# Patient Record
Sex: Male | Born: 1960 | Race: White | Hispanic: No | Smoking: Never smoker
Health system: Southern US, Community
[De-identification: ages and names within clinical notes are randomized; demographics above are authoritative.]

## PROBLEM LIST (undated history)

## (undated) DIAGNOSIS — Z9889 Other specified postprocedural states: Secondary | ICD-10-CM

## (undated) DIAGNOSIS — Z87442 Personal history of urinary calculi: Secondary | ICD-10-CM

## (undated) DIAGNOSIS — E039 Hypothyroidism, unspecified: Secondary | ICD-10-CM

## (undated) DIAGNOSIS — R112 Nausea with vomiting, unspecified: Secondary | ICD-10-CM

## (undated) HISTORY — DX: Hypothyroidism, unspecified: E03.9

## (undated) HISTORY — PX: SHOULDER ARTHROSCOPY: SHX128

---

## 2003-03-04 ENCOUNTER — Encounter: Payer: Self-pay | Admitting: Internal Medicine

## 2003-03-04 ENCOUNTER — Encounter: Admission: RE | Admit: 2003-03-04 | Discharge: 2003-03-04 | Payer: Self-pay | Admitting: Internal Medicine

## 2006-05-08 ENCOUNTER — Ambulatory Visit: Payer: Self-pay | Admitting: Internal Medicine

## 2006-06-19 ENCOUNTER — Ambulatory Visit: Payer: Self-pay | Admitting: Internal Medicine

## 2006-06-19 LAB — CONVERTED CEMR LAB
ALT: 28 units/L (ref 0–40)
AST: 24 units/L (ref 0–37)
Albumin: 4.1 g/dL (ref 3.5–5.2)
BUN: 10 mg/dL (ref 6–23)
Calcium: 9.6 mg/dL (ref 8.4–10.5)
Glucose, Bld: 103 mg/dL — ABNORMAL HIGH (ref 70–99)
LDL DIRECT: 149.2 mg/dL
RBC: 4.95 M/uL (ref 4.22–5.81)
RDW: 12.7 % (ref 11.5–14.6)
Total Protein: 7.3 g/dL (ref 6.0–8.3)
WBC: 4.8 10*3/uL (ref 4.5–10.5)

## 2007-03-18 ENCOUNTER — Ambulatory Visit: Payer: Self-pay | Admitting: Internal Medicine

## 2007-05-13 ENCOUNTER — Ambulatory Visit: Payer: Self-pay | Admitting: Internal Medicine

## 2007-06-18 ENCOUNTER — Ambulatory Visit: Payer: Self-pay | Admitting: Internal Medicine

## 2007-06-18 LAB — CONVERTED CEMR LAB
Blood in Urine, dipstick: NEGATIVE
Glucose, Urine, Semiquant: NEGATIVE
Ketones, urine, test strip: NEGATIVE
Specific Gravity, Urine: 1.01
Urobilinogen, UA: NEGATIVE
pH: 5

## 2007-06-23 LAB — CONVERTED CEMR LAB
AST: 30 units/L (ref 0–37)
BUN: 13 mg/dL (ref 6–23)
Basophils Relative: 0.5 % (ref 0.0–1.0)
Chloride: 109 meq/L (ref 96–112)
Eosinophils Absolute: 0 10*3/uL (ref 0.0–0.6)
Eosinophils Relative: 0.9 % (ref 0.0–5.0)
GFR calc Af Amer: 84 mL/min
Glucose, Bld: 93 mg/dL (ref 70–99)
HCT: 39.8 % (ref 39.0–52.0)
MCV: 86.4 fL (ref 78.0–100.0)
Monocytes Absolute: 0.6 10*3/uL (ref 0.2–0.7)
Potassium: 4.2 meq/L (ref 3.5–5.1)
RBC: 4.61 M/uL (ref 4.22–5.81)
Total CHOL/HDL Ratio: 5.9
Triglycerides: 142 mg/dL (ref 0–149)
VLDL: 28 mg/dL (ref 0–40)

## 2008-05-16 ENCOUNTER — Ambulatory Visit: Payer: Self-pay | Admitting: Internal Medicine

## 2008-05-23 ENCOUNTER — Encounter (INDEPENDENT_AMBULATORY_CARE_PROVIDER_SITE_OTHER): Payer: Self-pay | Admitting: *Deleted

## 2008-05-23 LAB — CONVERTED CEMR LAB
ALT: 29 units/L (ref 0–53)
BUN: 14 mg/dL (ref 6–23)
Basophils Absolute: 0.1 10*3/uL (ref 0.0–0.1)
Calcium: 9.2 mg/dL (ref 8.4–10.5)
Creatinine, Ser: 1 mg/dL (ref 0.4–1.5)
GFR calc Af Amer: 103 mL/min
GFR calc non Af Amer: 85 mL/min
Glucose, Bld: 91 mg/dL (ref 70–99)
HCT: 39.3 % (ref 39.0–52.0)
Hemoglobin: 13.6 g/dL (ref 13.0–17.0)
MCHC: 34.6 g/dL (ref 30.0–36.0)
MCV: 87.1 fL (ref 78.0–100.0)
Neutrophils Relative %: 47.2 % (ref 43.0–77.0)
Platelets: 268 10*3/uL (ref 150–400)
Potassium: 4.6 meq/L (ref 3.5–5.1)
Total CHOL/HDL Ratio: 5.4
VLDL: 22 mg/dL (ref 0–40)
WBC: 5 10*3/uL (ref 4.5–10.5)

## 2009-05-31 ENCOUNTER — Ambulatory Visit: Payer: Self-pay | Admitting: Internal Medicine

## 2009-06-06 LAB — CONVERTED CEMR LAB
ALT: 30 units/L (ref 0–53)
AST: 25 units/L (ref 0–37)
BUN: 14 mg/dL (ref 6–23)
Basophils Relative: 0.2 % (ref 0.0–3.0)
Calcium: 9.4 mg/dL (ref 8.4–10.5)
Creatinine, Ser: 1.1 mg/dL (ref 0.4–1.5)
Direct LDL: 152.1 mg/dL
Eosinophils Absolute: 0 10*3/uL (ref 0.0–0.7)
Eosinophils Relative: 0.6 % (ref 0.0–5.0)
GFR calc non Af Amer: 75.84 mL/min (ref >60)
Glucose, Bld: 90 mg/dL (ref 70–99)
HDL: 37.3 mg/dL — ABNORMAL LOW (ref >39.00)
Lymphs Abs: 2 10*3/uL (ref 0.7–4.0)
Monocytes Absolute: 0.5 10*3/uL (ref 0.1–1.0)
Neutro Abs: 3.4 10*3/uL (ref 1.4–7.7)
Neutrophils Relative %: 56.7 % (ref 43.0–77.0)
Platelets: 248 10*3/uL (ref 150.0–400.0)
RDW: 12.8 % (ref 11.5–14.6)
VLDL: 20.4 mg/dL (ref 0.0–40.0)

## 2009-06-07 ENCOUNTER — Encounter: Payer: Self-pay | Admitting: Internal Medicine

## 2009-09-27 ENCOUNTER — Ambulatory Visit: Payer: Self-pay | Admitting: Internal Medicine

## 2009-09-27 DIAGNOSIS — E039 Hypothyroidism, unspecified: Secondary | ICD-10-CM | POA: Insufficient documentation

## 2009-09-28 ENCOUNTER — Telehealth: Payer: Self-pay | Admitting: Internal Medicine

## 2009-09-28 LAB — CONVERTED CEMR LAB
Free T4: 0.5 ng/dL — ABNORMAL LOW (ref 0.6–1.6)
T3, Free: 2.4 pg/mL (ref 2.3–4.2)

## 2009-11-15 ENCOUNTER — Ambulatory Visit: Payer: Self-pay | Admitting: Internal Medicine

## 2009-11-15 ENCOUNTER — Telehealth (INDEPENDENT_AMBULATORY_CARE_PROVIDER_SITE_OTHER): Payer: Self-pay | Admitting: *Deleted

## 2010-01-05 ENCOUNTER — Ambulatory Visit: Payer: Self-pay | Admitting: Internal Medicine

## 2010-06-05 ENCOUNTER — Ambulatory Visit: Payer: Self-pay | Admitting: Internal Medicine

## 2010-06-05 ENCOUNTER — Encounter: Payer: Self-pay | Admitting: Internal Medicine

## 2010-06-20 ENCOUNTER — Ambulatory Visit: Payer: Self-pay | Admitting: Internal Medicine

## 2010-06-22 LAB — CONVERTED CEMR LAB
BUN: 14 mg/dL (ref 6–23)
Basophils Relative: 0.4 % (ref 0.0–3.0)
CO2: 29 meq/L (ref 19–32)
Calcium: 9.1 mg/dL (ref 8.4–10.5)
Chloride: 107 meq/L (ref 96–112)
GFR calc non Af Amer: 77.12 mL/min (ref >60)
HCT: 40.5 % (ref 39.0–52.0)
HDL: 42.3 mg/dL (ref >39.00)
Hemoglobin: 13.6 g/dL (ref 13.0–17.0)
Lymphs Abs: 1.8 10*3/uL (ref 0.7–4.0)
MCV: 87.2 fL (ref 78.0–100.0)
Monocytes Absolute: 0.4 10*3/uL (ref 0.1–1.0)
Neutro Abs: 2.7 10*3/uL (ref 1.4–7.7)
Neutrophils Relative %: 54.6 % (ref 43.0–77.0)
Potassium: 4.4 meq/L (ref 3.5–5.1)
Triglycerides: 138 mg/dL (ref 0.0–149.0)
VLDL: 27.6 mg/dL (ref 0.0–40.0)

## 2010-10-02 NOTE — Assessment & Plan Note (Signed)
Summary: sinus infection/kdc 12:30pm lab TSH free T3 and T4/kdc   Vital Signs:  Patient profile:   50 year old male Height:      72 inches Weight:      228.2 pounds Temp:     98.3 degrees F oral Pulse rate:   63 / minute BP sitting:   100 / 80  (left arm) Cuff size:   large  Vitals Entered By: Kandice Hams (September 27, 2009 12:42 PM) CC: C/O PRESSURE LEFT SIDE EYE,  EAR, DIZZY DENIES COUGH   History of Present Illness: is not feeling well for 5 to 6 days, achy, tired also has developed pain at the left face, the pain encompass the forehead, orbital  and  pre-auricular area   also , needs TSH  Allergies: 1)  ! Sulfa  Past History:  Past Medical History: Reviewed history from 05/16/2008 and no changes required. ED , mild  Past Surgical History: Reviewed history from 05/13/2007 and no changes required. shoulder arthroscopy  Social History: Reviewed history from 05/31/2009 and no changes required. Married two teenager boys Never Smoked Alcohol use-yes (occasionally) Drug use-no Regular exercise-yes (walks & works in yard) Occupation: Psychologist, occupational   Review of Systems       denies fever no Colles no runny nose or sore throat.  No nasal discharge no difficulty hearing no facial rash but he did noted a  sore in his palate  Physical Exam  General:  alert and well-developed.   Head:  face symmetric, no tender to palpation, no rash noted Eyes:  external ocular  movements are intact,pupils equal and reactive Ears:  R ear normal and L ear normal.  specifically no rash noted Nose:  not congested Mouth:  hard palate  has two confluent-shallow ulcers with a white base, 2 mm in size .  They are left from the midline Neurologic:  speech gait and motor are intact   Impression & Recommendations:  Problem # 1:  FACIAL PAIN (ICD-784.0) no evidence  of sinusitis DDX includes   trigeminal neuralgia versus herpes ( given the two shallow ulcers at the palate) plan: although  is  a little late will prescribe Valtrex Vicodin patient to call if symptoms increase, rash, no better in few  days Will consider further workup  His updated medication list for this problem includes:    Vicodin 5-500 Mg Tabs (Hydrocodone-acetaminophen) ..... One by mouth every 4 hours as needed for pain  Problem # 2:  ? of HYPOTHYROIDISM (ICD-244.9) labs  Orders: Venipuncture (40981) TLB-T4 (Thyrox), Free [phone]) TLB-T3, Free (Triiodothyronine) (84481-T3FREE) TLB-TSH (Thyroid Stimulating Hormone) (84443-TSH)  Complete Medication List: 1)  Valtrex 1 Gm Tabs (Valacyclovir hcl) .... One by mouth 3 times a day for one week 2)  Vicodin 5-500 Mg Tabs (Hydrocodone-acetaminophen) .... One by mouth every 4 hours as needed for pain Prescriptions: VICODIN 5-500 MG TABS (HYDROCODONE-ACETAMINOPHEN) one by mouth every 4 hours as needed for pain  #30 x 0   Entered and Authorized by:   Nolon Rod. Kadejah Sandiford MD   Signed by:   Nolon Rod. Jisselle Poth MD on 09/27/2009   Method used:   Print then Give to Patient   RxID:   [id number] VALTREX 1 GM TABS (VALACYCLOVIR HCL) one by mouth 3 times a day for one week  #21 x 0   Entered and Authorized by:   Nolon Rod. Arieliz Latino MD   Signed by:   Nolon Rod. Ondrea Dow MD on 09/27/2009   Method used:   Print  then Give to Patient   RxID:   [id number]

## 2010-10-02 NOTE — Progress Notes (Signed)
Summary: facial pain  Phone Note Outgoing Call Call back at cell [phone]   Summary of Call: --advise patient: has developed hypothyroidism, a benign condition but does need tretment ; no w/u needed at this time. rec. to start synthroid 1 a day, TSH 6 weeks --how is the facial pain ? (see last OV) Jarrett Chicoine E. Quenisha Lovins MD  September 28, 2009 7:43 PM   Left message to have pt return call Shary Decamp  September 29, 2009 11:27 AM   Follow-up for Phone Call        1. discussed labs with pt - rx called in 2. facial pain - no change since ov Follow-up by: Shary Decamp,  September 29, 2009 2:57 PM  Additional Follow-up for Phone Call Additional follow up Details #1::        LMOM, asked to call us if he is not better (facial pain) so we can refer to neurology (Dr Clarisse Gouge) Nolon Rod. Ken Bonn MD  October 05, 2009 1:25 PM     New/Updated Medications: SYNTHROID 50 MCG TABS (LEVOTHYROXINE SODIUM) 1 by mouth once daily - DUE TSH IN 6 WEEKS Prescriptions: SYNTHROID 50 MCG TABS (LEVOTHYROXINE SODIUM) 1 by mouth once daily - DUE TSH IN 6 WEEKS  #30 x 1   Entered by:   Shary Decamp   Authorized by:   Nolon Rod. Vipul Cafarelli MD   Signed by:   Shary Decamp on 09/29/2009   Method used:   Electronically to        [facility] (retail)       [address]       [place], [place]  [address]       Ph: [phone]       Fax: [phone]   RxID:   [id number]

## 2010-10-02 NOTE — Assessment & Plan Note (Signed)
Summary: cpx/kn   Vital Signs:  Patient profile:   50 year old male Height:      72 inches Weight:      228 pounds BMI:     31.03 Pulse rate:   86 / minute Pulse rhythm:   regular BP sitting:   124 / 76  (left arm) Cuff size:   large  Vitals Entered By: Army Fossa CMA (June 05, 2010 3:08 PM) CC: CPX, not fasting Comments declines flu shot [facility]    History of Present Illness: CPX  Current Medications (verified): 1)  Valtrex 1 Gm Tabs (Valacyclovir Hcl) .... One By Mouth 3 Times A Day For One Week 2)  Synthroid 88 Mcg Tabs (Levothyroxine Sodium) .Marland Kitchen.. 1 Daily - Due Tsh in 6 Weeks  Allergies (verified): 1)  ! Sulfa  Past History:  Past Medical History: hypothyroidism  Past Surgical History: Reviewed history from 05/13/2007 and no changes required. shoulder arthroscopy  Family History: CAD--F  had a CABG age 37 prostate ca--no colon cancer--no Grandmother had diabetes. + juvenile diabetes-niece  Social History: Married two teenager boys Never Smoked Alcohol use-yes (occasionally) Drug use-no Regular exercise-yes (walks-golf  & works in yard) Occupation: Psychologist, occupational   Review of Systems CV:  Denies chest pain or discomfort, palpitations, and swelling of feet. Resp:  Denies cough and shortness of breath. GI:  Denies bloody stools, diarrhea, nausea, and vomiting. GU:  Denies dysuria, hematuria, urinary frequency, and urinary hesitancy. Psych:  ++ stress @ work  but doing ok.  Physical Exam  General:  alert, well-developed, and well-nourished.   Neck:  no masses, no thyromegaly, and normal carotid upstroke.   Lungs:  normal respiratory effort, no intercostal retractions, no accessory muscle use, and normal breath sounds.   Heart:  normal rate, regular rhythm, no murmur, and no gallop.   Abdomen:  soft, non-tender, no distention, no masses, no guarding, and no rigidity.   Extremities:  no pretibial edema bilaterally  Psych:  Oriented X3, memory  intact for recent and remote, normally interactive, good eye contact, not anxious appearing, and not depressed appearing.     Impression & Recommendations:  Problem # 1:  HEALTH SCREENING (ICD-V70.0)  Td 2007 continue healthy life style flu shot -- declined labs including a TSH new FH: father had a CABG age 32 EKG today with no acute changes. start colon-prostate ca screening next year  Orders: EKG w/ Interpretation (93000)  Problem # 2:  HYPOTHYROIDISM (ICD-244.9) started meds 09-2009 feels about the same  labs  target TSH aprox 1.0 His updated medication list for this problem includes:    Synthroid 88 Mcg Tabs (Levothyroxine sodium) .Marland Kitchen... 1 daily - due tsh in 6 weeks  Complete Medication List: 1)  Valtrex 1 Gm Tabs (Valacyclovir hcl) .... One by mouth 3 times a day for one week 2)  Synthroid 88 Mcg Tabs (Levothyroxine sodium) .Marland Kitchen.. 1 daily - due tsh in 6 weeks  Patient Instructions: 1)  came back fasting: 2)  FLP AST ALT TSH CBC BMP-----  dx V70  3)  TSH-----dx hypothyroidism 4)  Please schedule a follow-up appointment in 1 year.

## 2010-11-09 ENCOUNTER — Other Ambulatory Visit: Payer: Self-pay | Admitting: Internal Medicine

## 2010-11-09 ENCOUNTER — Other Ambulatory Visit (INDEPENDENT_AMBULATORY_CARE_PROVIDER_SITE_OTHER): Payer: BC Managed Care – PPO

## 2010-11-09 ENCOUNTER — Encounter (INDEPENDENT_AMBULATORY_CARE_PROVIDER_SITE_OTHER): Payer: Self-pay | Admitting: *Deleted

## 2010-11-09 DIAGNOSIS — E039 Hypothyroidism, unspecified: Secondary | ICD-10-CM

## 2010-12-17 ENCOUNTER — Other Ambulatory Visit: Payer: Self-pay | Admitting: Internal Medicine

## 2011-07-22 ENCOUNTER — Other Ambulatory Visit: Payer: Self-pay | Admitting: Internal Medicine

## 2011-07-23 NOTE — Telephone Encounter (Signed)
Ok 30 and 2 RF Also tell pt:  due for OV

## 2011-08-13 ENCOUNTER — Other Ambulatory Visit: Payer: Self-pay | Admitting: Internal Medicine

## 2011-08-13 NOTE — Telephone Encounter (Signed)
Pt c/o tenderness in mouth. Last filled 09-27-09 VALTREX 1 GM TABS (VALACYCLOVIR HCL) one by mouth 3 times a day for one week  #21 x 0, last OV 06-20-10

## 2011-08-14 MED ORDER — VALACYCLOVIR HCL 1 G PO TABS: 1000.0000 mg | ORAL_TABLET | Freq: Three times a day (TID) | ORAL | Status: DC

## 2011-08-14 NOTE — Telephone Encounter (Signed)
Patient called back to check if rx was called in - [facility] - patient scheduled cpx [phone]

## 2011-08-14 NOTE — Telephone Encounter (Signed)
I did review my note from 09/27/2009, Valtrex was prescribed for facial pain. Okay to call: VALTREX 1 GM TABS (VALACYCLOVIR HCL) one by mouth 3 times a day for one week #21 x 0 At the same time, if he is not better, he needs to be seen. Also, he is due for a routine office visit, please make an appointment

## 2011-08-14 NOTE — Telephone Encounter (Signed)
Left message to call office

## 2011-08-16 NOTE — Telephone Encounter (Signed)
Left message to call office

## 2011-08-20 NOTE — Telephone Encounter (Signed)
Tried to call Pt VM full will try again later.

## 2011-08-28 ENCOUNTER — Encounter: Payer: Self-pay | Admitting: *Deleted

## 2011-08-28 NOTE — Telephone Encounter (Signed)
Tried to call Pt again VM still full after several attempt to contact Pt Rx sent to pharmacy, letter Mail advising Pt to schedule OV.

## 2011-08-29 ENCOUNTER — Encounter: Payer: Self-pay | Admitting: Internal Medicine

## 2011-09-06 ENCOUNTER — Encounter: Payer: Self-pay | Admitting: Internal Medicine

## 2011-09-06 ENCOUNTER — Ambulatory Visit (INDEPENDENT_AMBULATORY_CARE_PROVIDER_SITE_OTHER): Payer: BC Managed Care – PPO | Admitting: Internal Medicine

## 2011-09-06 DIAGNOSIS — Z0000 Encounter for general adult medical examination without abnormal findings: Secondary | ICD-10-CM

## 2011-09-06 LAB — CBC WITH DIFFERENTIAL/PLATELET
Eosinophils Relative: 0.8 % (ref 0.0–5.0)
HCT: 40.7 % (ref 39.0–52.0)
Hemoglobin: 13.5 g/dL (ref 13.0–17.0)
Lymphocytes Relative: 29.9 % (ref 12.0–46.0)
Lymphs Abs: 1.3 10*3/uL (ref 0.7–4.0)
Monocytes Relative: 12 % (ref 3.0–12.0)
Platelets: 276 10*3/uL (ref 150.0–400.0)
WBC: 4.5 10*3/uL (ref 4.5–10.5)

## 2011-09-06 LAB — COMPREHENSIVE METABOLIC PANEL
CO2: 27 mEq/L (ref 19–32)
Calcium: 8.9 mg/dL (ref 8.4–10.5)
Creatinine, Ser: 1.1 mg/dL (ref 0.4–1.5)
GFR: 72.84 mL/min (ref >60.00)
Glucose, Bld: 105 mg/dL — ABNORMAL HIGH (ref 70–99)
Total Bilirubin: 0.6 mg/dL (ref 0.3–1.2)
Total Protein: 7 g/dL (ref 6.0–8.3)

## 2011-09-06 LAB — LIPID PANEL
Cholesterol: 219 mg/dL — ABNORMAL HIGH (ref 0–200)
Total CHOL/HDL Ratio: 5

## 2011-09-06 LAB — LDL CHOLESTEROL, DIRECT: Direct LDL: 143 mg/dL

## 2011-09-06 NOTE — Progress Notes (Signed)
  Subjective:    Patient ID: Brad Stewart, male    DOB: 1961/03/14, 51 y.o.   MRN: [id number]  HPI CPX  Past Medical History: hypothyroidism  Past Surgical History: shoulder arthroscopy  Family History: CAD--F  had a CABG age 79 prostate ca--no colon cancer--no Grandmother had diabetes. + juvenile diabetes-niece  Social History: Married, two teenager boys Never Smoked Alcohol use-yes (occasionally) Drug use-no Regular exercise--used to exercise more Occupation: Psychologist, occupational   Review of Systems  Respiratory: Negative for cough and shortness of breath.   Cardiovascular: Negative for chest pain and leg swelling.  Gastrointestinal: Negative for abdominal pain and blood in stool.  Genitourinary: Negative for hematuria and difficulty urinating.  Psychiatric/Behavioral:       + stress,  No depression, anxiety       Objective:   Physical Exam  Constitutional: He is oriented to person, place, and time. He appears well-developed and well-nourished. No distress.  HENT:  Head: Normocephalic and atraumatic.  Neck: No thyromegaly present.  Cardiovascular: Normal rate, regular rhythm and normal heart sounds.   No murmur heard. Pulmonary/Chest: Effort normal and breath sounds normal. No respiratory distress. He has no wheezes. He has no rales.  Abdominal: Soft. Bowel sounds are normal. He exhibits no distension. There is no tenderness. There is no rebound and no guarding.  Genitourinary: Rectum normal and prostate normal.       Brown stools  Musculoskeletal: He exhibits no edema.  Neurological: He is alert and oriented to person, place, and time.  Skin: He is not diaphoretic.  Psychiatric: He has a normal mood and affect. His behavior is normal. Judgment and thought content normal.       Assessment & Plan:

## 2011-09-06 NOTE — Assessment & Plan Note (Addendum)
Td 2007, declined flu shot  continue healthy life style labs including a TSH Has a FH of CAD ( father at age 51) DRE, PSA today Cscope v iFOB discussed, iFOB provided , will call when ready for a cscope  Diet,exercise dicussed

## 2011-09-12 ENCOUNTER — Encounter: Payer: Self-pay | Admitting: *Deleted

## 2011-11-01 ENCOUNTER — Other Ambulatory Visit: Payer: Self-pay | Admitting: Internal Medicine

## 2011-11-01 NOTE — Telephone Encounter (Signed)
Refill done.  

## 2011-11-28 ENCOUNTER — Other Ambulatory Visit: Payer: Self-pay | Admitting: *Deleted

## 2011-11-28 DIAGNOSIS — Z1211 Encounter for screening for malignant neoplasm of colon: Secondary | ICD-10-CM

## 2011-12-10 ENCOUNTER — Other Ambulatory Visit: Payer: Self-pay | Admitting: *Deleted

## 2011-12-10 MED ORDER — VALACYCLOVIR HCL 1 G PO TABS: ORAL_TABLET | ORAL | Status: DC

## 2011-12-10 NOTE — Telephone Encounter (Signed)
Rx sent left Pt detail message. 

## 2011-12-10 NOTE — Telephone Encounter (Signed)
Pt states that he has another sore on his mouth and would like to get Rx for valtrex. Please advise

## 2011-12-10 NOTE — Telephone Encounter (Signed)
Tell pt I sent the Rx

## 2012-03-13 ENCOUNTER — Other Ambulatory Visit: Payer: Self-pay | Admitting: Internal Medicine

## 2012-03-13 NOTE — Telephone Encounter (Signed)
Refill done.  

## 2012-03-24 ENCOUNTER — Telehealth: Payer: Self-pay | Admitting: *Deleted

## 2012-03-24 MED ORDER — SILDENAFIL CITRATE 100 MG PO TABS: 50.0000 mg | ORAL_TABLET | Freq: Every day | ORAL | Status: DC | PRN

## 2012-03-24 NOTE — Telephone Encounter (Signed)
Years ago, he had issues with ED and I gave him samples of Viagra. Okay to call in Viagra 100 mg half or one tablet once daily at needed, #10, 1  Refills. Patient to call if he has side effects

## 2012-03-24 NOTE — Telephone Encounter (Signed)
Discuss with patient, Rx sent. 

## 2012-10-13 ENCOUNTER — Other Ambulatory Visit: Payer: Self-pay | Admitting: Internal Medicine

## 2012-10-13 NOTE — Telephone Encounter (Signed)
Refill done.  Pt has future appt 2.21.14

## 2012-10-23 ENCOUNTER — Ambulatory Visit (INDEPENDENT_AMBULATORY_CARE_PROVIDER_SITE_OTHER): Payer: BC Managed Care – PPO | Admitting: Internal Medicine

## 2012-10-23 ENCOUNTER — Encounter: Payer: Self-pay | Admitting: Internal Medicine

## 2012-10-23 VITALS — BP 114/80 | HR 70 | Ht 72.5 in | Wt 228.0 lb

## 2012-10-23 DIAGNOSIS — E039 Hypothyroidism, unspecified: Secondary | ICD-10-CM

## 2012-10-23 DIAGNOSIS — Z0000 Encounter for general adult medical examination without abnormal findings: Secondary | ICD-10-CM

## 2012-10-23 LAB — CBC WITH DIFFERENTIAL/PLATELET
Basophils Absolute: 0 10*3/uL (ref 0.0–0.1)
Basophils Relative: 0.2 % (ref 0.0–3.0)
Eosinophils Absolute: 0.1 10*3/uL (ref 0.0–0.7)
MCHC: 33.5 g/dL (ref 30.0–36.0)
MCV: 86.1 fl (ref 78.0–100.0)
Monocytes Absolute: 0.6 10*3/uL (ref 0.1–1.0)
Neutrophils Relative %: 54.9 % (ref 43.0–77.0)
Platelets: 302 10*3/uL (ref 150.0–400.0)
RBC: 4.66 Mil/uL (ref 4.22–5.81)
RDW: 13.6 % (ref 11.5–14.6)

## 2012-10-23 LAB — COMPREHENSIVE METABOLIC PANEL
ALT: 24 U/L (ref 0–53)
AST: 28 U/L (ref 0–37)
Alkaline Phosphatase: 68 U/L (ref 39–117)
CO2: 28 mEq/L (ref 19–32)
GFR: 71.78 mL/min (ref >60.00)
Sodium: 140 mEq/L (ref 135–145)
Total Bilirubin: 0.5 mg/dL (ref 0.3–1.2)
Total Protein: 7.2 g/dL (ref 6.0–8.3)

## 2012-10-23 LAB — PSA: PSA: 0.45 ng/mL (ref 0.10–4.00)

## 2012-10-23 LAB — LIPID PANEL
Total CHOL/HDL Ratio: 5
Triglycerides: 136 mg/dL (ref 0.0–149.0)

## 2012-10-23 MED ORDER — DOXYCYCLINE HYCLATE 100 MG PO TABS: 100.0000 mg | ORAL_TABLET | Freq: Two times a day (BID) | ORAL | Status: DC

## 2012-10-23 MED ORDER — HYDROCODONE-HOMATROPINE 5-1.5 MG/5ML PO SYRP: 5.0000 mL | ORAL_SOLUTION | Freq: Every evening | ORAL | Status: DC | PRN

## 2012-10-23 NOTE — Assessment & Plan Note (Signed)
Labs

## 2012-10-23 NOTE — Assessment & Plan Note (Signed)
Td 2007 continue healthy life style Labs  Has a FH of CAD ( father at age 52) DRE declined , PSA today Cscope v iFOB discussed, iFOB provided , will call when ready for a cscope  Diet,exercise -- doing great, praised  RTC 1 year

## 2012-10-23 NOTE — Progress Notes (Signed)
  Subjective:    Patient ID: Brad Stewart, male    DOB: 31-Dec-1960, 52 y.o.   MRN: [id number]  HPI Complete physical exam  Past Medical History  Diagnosis Date  . Thyroid disease     hypothyroidism   Past Surgical History  Procedure Laterality Date  . Shoulder arthroscopy     Family History  Problem Relation Age of Onset  . Coronary artery disease Father 20    had CABG  . Diabetes Other     grandmother, F (late onset)  . Colon cancer Neg Hx   . Prostate cancer Neg Hx   . Stroke Neg Hx    History   Social History  . Marital Status: Married    Spouse Name: N/A    Number of Children: 2  . Years of Education: N/A   Occupational History  . banker     Social History Main Topics  . Smoking status: Never Smoker   . Smokeless tobacco: Never Used  . Alcohol Use: Yes     Comment: socially   . Drug Use: Not on file  . Sexually Active: Not on file   Other Topics Concern  . Not on file   Social History Narrative   Diet: healthy most of the time    Exercise: very active, 4-6/week     Review of Systems In general doing well, he developed cough 10 days ago, sometimes unable to stop coughing, not able to sleep well. No sore throat per se. No fever chills. Some sinus congestion but mild. Denies any chest or shortness of breath. No dysuria gross nocturia. No fever chills No nausea, vomiting, diarrhea or myalgias.     Objective:   Physical Exam  General -- alert, well-developed, BMI 30 .   Neck --no thyromegaly HEENT -- TMs normal, throat w/o redness, face symmetric and not tender to palpation, nose slt congested   Lungs -- normal respiratory effort, no intercostal retractions, no accessory muscle use, and normal breath sounds.   Heart-- normal rate, regular rhythm, no murmur, and no gallop.   Abdomen--soft, non-tender, no distention, no masses, no HSM, no guarding, and no rigidity.   Extremities-- no pretibial edema bilaterally Rectal--declined Neurologic-- alert  & oriented X3 and strength normal in all extremities. Psych-- Cognition and judgment appear intact. Alert and cooperative with normal attention span and concentration.  not anxious appearing and not depressed appearing.        Assessment & Plan:   Cough, likely atypical bronchitis, see instructions

## 2012-10-23 NOTE — Patient Instructions (Signed)
Rest, fluids , tylenol For cough, take Mucinex DM twice a day as needed  Hydrocodone at night if cough severe Take the antibiotic as prescribed  (doxycycline) Call if no better in few days Call anytime if the symptoms are severe  ------ Return the stool card

## 2012-10-24 ENCOUNTER — Encounter: Payer: Self-pay | Admitting: Internal Medicine

## 2012-11-03 ENCOUNTER — Other Ambulatory Visit: Payer: Self-pay | Admitting: Internal Medicine

## 2012-11-03 ENCOUNTER — Telehealth: Payer: Self-pay | Admitting: *Deleted

## 2012-11-03 MED ORDER — LEVOTHYROXINE SODIUM 150 MCG PO TABS: 150.0000 ug | ORAL_TABLET | Freq: Every day | ORAL | Status: DC

## 2012-11-03 NOTE — Telephone Encounter (Signed)
Refill done.  

## 2012-11-03 NOTE — Telephone Encounter (Signed)
Message copied by Verdene Rio on Tue Nov 03, 2012  2:18 PM ------      Message from: Wanda Plump      Created: Tue Oct 27, 2012  1:23 PM       Advise patient:      Cholesterol is satisfactory.      He needs more thyroid medication.      If he has been taking Synthroid 100 mcg once daily then call in a prescription call Synthroid 150 mcg , #30 and one refill.      If he has not been taking Synthroid 100  Mcg daily, then he needs to take one tablet daily.      Arrange for a TSH in 6 weeks.      Other labs wnl ------

## 2012-11-03 NOTE — Telephone Encounter (Signed)
Discuss with patient, Rx sent. 

## 2012-12-26 ENCOUNTER — Telehealth: Payer: Self-pay | Admitting: Internal Medicine

## 2012-12-26 NOTE — Telephone Encounter (Signed)
Due for TSH, please arrange 

## 2013-01-01 NOTE — Telephone Encounter (Signed)
Lmovm for pt to call office. °

## 2013-01-02 ENCOUNTER — Other Ambulatory Visit: Payer: Self-pay | Admitting: Internal Medicine

## 2013-01-04 ENCOUNTER — Other Ambulatory Visit (INDEPENDENT_AMBULATORY_CARE_PROVIDER_SITE_OTHER): Payer: BC Managed Care – PPO

## 2013-01-04 ENCOUNTER — Other Ambulatory Visit: Payer: BC Managed Care – PPO

## 2013-01-04 DIAGNOSIS — E039 Hypothyroidism, unspecified: Secondary | ICD-10-CM

## 2013-01-04 NOTE — Telephone Encounter (Signed)
Refill done.  

## 2013-01-04 NOTE — Telephone Encounter (Signed)
Pt coming in for labs today 01/04/13.

## 2013-01-05 LAB — TSH: TSH: 0.58 u[IU]/mL (ref 0.35–5.50)

## 2013-01-07 ENCOUNTER — Telehealth: Payer: Self-pay | Admitting: *Deleted

## 2013-01-07 DIAGNOSIS — E039 Hypothyroidism, unspecified: Secondary | ICD-10-CM

## 2013-01-07 NOTE — Telephone Encounter (Signed)
Lab orders entered

## 2013-03-08 ENCOUNTER — Encounter: Payer: Self-pay | Admitting: Internal Medicine

## 2013-06-21 ENCOUNTER — Other Ambulatory Visit: Payer: Self-pay | Admitting: Internal Medicine

## 2013-06-21 MED ORDER — VALACYCLOVIR HCL 1 G PO TABS: ORAL_TABLET | ORAL | Status: DC

## 2013-06-21 NOTE — Telephone Encounter (Signed)
Med filled.  

## 2013-06-23 ENCOUNTER — Other Ambulatory Visit (INDEPENDENT_AMBULATORY_CARE_PROVIDER_SITE_OTHER): Payer: BC Managed Care – PPO

## 2013-06-23 DIAGNOSIS — E039 Hypothyroidism, unspecified: Secondary | ICD-10-CM

## 2013-06-30 ENCOUNTER — Encounter: Payer: Self-pay | Admitting: *Deleted

## 2013-06-30 MED ORDER — LEVOTHYROXINE SODIUM 175 MCG PO TABS: ORAL_TABLET | ORAL | Status: DC

## 2013-07-01 ENCOUNTER — Telehealth: Payer: Self-pay | Admitting: *Deleted

## 2013-07-01 DIAGNOSIS — E039 Hypothyroidism, unspecified: Secondary | ICD-10-CM

## 2013-07-01 MED ORDER — LEVOTHYROXINE SODIUM 137 MCG PO TABS: 137.0000 ug | ORAL_TABLET | Freq: Every day | ORAL | Status: DC

## 2013-07-01 NOTE — Telephone Encounter (Signed)
Spoke with pt who had questions about his dose of synthroid. He states he was taking Synthroid 100 mcg in May (TSH .58 on 01/04/13). His current TSH on 06/23/13 was 6.22. The MyChart and labs result note states to increase Synthroid from to , although the patient stated he was only taking . Please clarify if the patient is to increase to 150 mcg or 175 mcg? I will call him to follow up.

## 2013-07-01 NOTE — Telephone Encounter (Signed)
Left message on pts voice mail with instructions to increase  Synthroid to 137 mcg and to schedule lab appt in 2 months to recheck levels.

## 2013-07-01 NOTE — Telephone Encounter (Signed)
If he is taking 100 mcg, then increase to synthroid 137 mcg 1 po qd #30, 3 RF and schedule a TSH in 2 months

## 2013-07-08 ENCOUNTER — Other Ambulatory Visit: Payer: Self-pay

## 2013-09-12 ENCOUNTER — Encounter: Payer: Self-pay | Admitting: Internal Medicine

## 2013-11-04 ENCOUNTER — Other Ambulatory Visit (INDEPENDENT_AMBULATORY_CARE_PROVIDER_SITE_OTHER): Payer: BC Managed Care – PPO

## 2013-11-04 DIAGNOSIS — E039 Hypothyroidism, unspecified: Secondary | ICD-10-CM

## 2013-11-05 LAB — TSH: TSH: 0.93 u[IU]/mL (ref 0.35–5.50)

## 2013-11-07 ENCOUNTER — Encounter: Payer: Self-pay | Admitting: Internal Medicine

## 2013-11-08 ENCOUNTER — Other Ambulatory Visit: Payer: Self-pay | Admitting: *Deleted

## 2013-11-08 DIAGNOSIS — E039 Hypothyroidism, unspecified: Secondary | ICD-10-CM

## 2013-11-08 MED ORDER — LEVOTHYROXINE SODIUM 137 MCG PO TABS: 137.0000 ug | ORAL_TABLET | Freq: Every day | ORAL | Status: DC

## 2013-12-08 ENCOUNTER — Other Ambulatory Visit: Payer: Self-pay | Admitting: Internal Medicine

## 2013-12-17 ENCOUNTER — Other Ambulatory Visit: Payer: Self-pay | Admitting: *Deleted

## 2013-12-17 MED ORDER — LEVOTHYROXINE SODIUM 137 MCG PO TABS: ORAL_TABLET | ORAL | Status: DC

## 2014-02-04 ENCOUNTER — Encounter: Payer: Self-pay | Admitting: Internal Medicine

## 2014-02-04 ENCOUNTER — Other Ambulatory Visit: Payer: Self-pay | Admitting: *Deleted

## 2014-02-04 MED ORDER — LEVOTHYROXINE SODIUM 137 MCG PO TABS: ORAL_TABLET | ORAL | Status: DC

## 2014-07-25 ENCOUNTER — Encounter: Payer: Self-pay | Admitting: Internal Medicine

## 2014-07-26 ENCOUNTER — Telehealth: Payer: Self-pay

## 2014-07-26 DIAGNOSIS — E039 Hypothyroidism, unspecified: Secondary | ICD-10-CM

## 2014-07-26 NOTE — Telephone Encounter (Signed)
Received message from Pt via Mychart requesting TSH, Dr. Larose Kells has agreed to [facility] lab test. Informed by Dr. Larose Kells that Pt is due for CPE within 6 weeks, okay to overbook. LMOM informing Pt that he can have TSH checked at time of CPE and to call office at earliest convenience to schedule CPE. TSH labs ordered.

## 2014-07-27 NOTE — Telephone Encounter (Signed)
Letter printed and mailed to Pt informing him he is due for CPE and to call the office at his earliest convenience to make appt.

## 2014-09-19 ENCOUNTER — Other Ambulatory Visit: Payer: Self-pay

## 2014-09-19 MED ORDER — LEVOTHYROXINE SODIUM 137 MCG PO TABS: ORAL_TABLET | ORAL | Status: DC

## 2014-10-11 ENCOUNTER — Ambulatory Visit (INDEPENDENT_AMBULATORY_CARE_PROVIDER_SITE_OTHER): Payer: BLUE CROSS/BLUE SHIELD | Admitting: Internal Medicine

## 2014-10-11 ENCOUNTER — Encounter: Payer: Self-pay | Admitting: Internal Medicine

## 2014-10-11 VITALS — BP 138/88 | HR 73 | Temp 97.9°F | Ht 73.0 in | Wt 240.4 lb

## 2014-10-11 DIAGNOSIS — L989 Disorder of the skin and subcutaneous tissue, unspecified: Secondary | ICD-10-CM

## 2014-10-11 DIAGNOSIS — Z0000 Encounter for general adult medical examination without abnormal findings: Secondary | ICD-10-CM

## 2014-10-11 NOTE — Progress Notes (Signed)
Pre visit review using our clinic review tool, if applicable. No additional management support is needed unless otherwise documented below in the visit note. 

## 2014-10-11 NOTE — Assessment & Plan Note (Addendum)
Td 2007 Flu shot discussed Labs  Has a FH of CAD ( father at age 54) DRE neg  , check a PSA  Interested in a colonoscopy, referral will be done.  Other issues: Hypothyroidism, reports good compliance with Synthroid, labs Fatigue, sleepy, has gained weight, lack of exercise: I did a Epworth sleepiness scale and scored 5 which is negative. Will check general labs, recommend to go back to routine exercise and let me know if he is not improving in the next 2 or 3 months. Also a healthy diet is recommended. Eye pain, see history of present illness and physical exam, recommend to see an ophthalmologist for further eval within a week. The patient states he will get that arranged. Also, 2 months ago developed a skin lesion at the right supraclavicular area, exam is confirmatory, has 34 mm skin lesion, hard, not hyperpigmented. Refer to dermatology

## 2014-10-11 NOTE — Patient Instructions (Signed)
Stop by the front desk and schedule labs to be done within few days (fasting)   Please come back to the office in 1 year for a physical exam. Come back fasting    He may need to get you back in few weeks to recheck your thyroid medication  If you are not better in few weeks regards the lack of energy, please call the office    If you need more information about a healthy diet  visit  the [employer], it  is a great resource online at:  http://www.richard-flynn.net/

## 2014-10-11 NOTE — Progress Notes (Signed)
Subjective:    Patient ID: Brad Stewart, male    DOB: 06/19/1961, 54 y.o.   MRN: [id number]  DOS:  10/11/2014 Type of visit - description : CPX Interval history: In general doing okay except for the last few weeks he has feeling "foggy" and tired. Sometimes feels sleepy. Denies heavy snoring Has been gaining weight, admits that he had no time to exercise. Also 2 weeks history of pressure behind the right eye when he blinks, exercises or moves. Symptoms are not severe, vision is normal, denies any redness or discharge. No sinusitis type of symptoms.    Review of Systems  Constitutional: Negative for diaphoresis and appetite change.  HENT: Negative for dental problem, ear discharge, facial swelling, trouble swallowing and voice change.   Eyes: Negative for photophobia, discharge and redness.  Respiratory:       No cough or sputum production. No wheezing or SOB  Cardiovascular:       No CP No edema or  palpitations   Gastrointestinal: Negative for nausea, vomiting, abdominal pain and diarrhea.       No blood in the stools   Endocrine: Negative for polydipsia, polyphagia and polyuria.  Genitourinary: Negative for urgency, frequency, hematuria and difficulty urinating.       No dysuria  Musculoskeletal: Negative for joint swelling.       No unusual aches or pains   Skin: Negative for color change, pallor and rash.  Allergic/Immunologic: Negative for environmental allergies and food allergies.  Neurological: Negative for dizziness and syncope.       No headaches   Hematological: Negative for adenopathy. Does not bruise/bleed easily.  Psychiatric/Behavioral: Negative for suicidal ideas, hallucinations, behavioral problems and confusion.       No unusual or severe anxiety-depression     Past Medical History  Diagnosis Date  . Hypothyroidism     Past Surgical History  Procedure Laterality Date  . Shoulder arthroscopy Left     History   Social History  . Marital Status:  Married    Spouse Name: N/A    Number of Children: 2  . Years of Education: N/A   Occupational History  . banker     Social History Main Topics  . Smoking status: Never Smoker   . Smokeless tobacco: Never Used  . Alcohol Use: 0.0 oz/week    0 Not specified per week     Comment: socially   . Drug Use: No  . Sexual Activity: Not on file   Other Topics Concern  . Not on file   Social History Narrative   Household-- pt and wife , 2 adult children     Family History  Problem Relation Age of Onset  . Coronary artery disease Father 70    had CABG  . Diabetes Other     grandmother, F (late onset)  . Colon cancer Neg Hx   . Prostate cancer Neg Hx   . Stroke Neg Hx       Medication List       This list is accurate as of: 10/11/14  7:21 PM.  Always use your most recent med list.               levothyroxine 137 MCG tablet  Commonly known as:  SYNTHROID, LEVOTHROID  TAKE ONE TABLET BY MOUTH ONE TIME DAILY  BEFORE BREAKFAST.     valACYclovir 1000 MG tablet  Commonly known as:  VALTREX  2 tabs by mouth with the onset of  cold sores, repeat dose 12 hours later. Only a total of 4 tablets per episode           Objective:   Physical Exam  Constitutional: He is oriented to person, place, and time. He appears well-developed. No distress.  HENT:  Head: Normocephalic and atraumatic.  Eyes: Conjunctivae and EOM are normal. Pupils are equal, round, and reactive to light. Right eye exhibits no discharge. Left eye exhibits no discharge.  Bilateral funduscopy, undilated, no obvious abnormalities. Anterior chambers normal  Cardiovascular:  RRR, no murmur, rub or gallop  Pulmonary/Chest: Effort normal. No respiratory distress.  CTA B  Abdominal: Soft. Bowel sounds are normal. He exhibits no distension and no mass. There is no tenderness. There is no rebound and no guarding.  Genitourinary:  Rectal: No external abnormalities noted. Normal sphincter tone. No rectal masses or  tenderness.  Stool: brown Prostate: Prostate gland firm and smooth, no enlargement, nodularity, tenderness, mass, asymmetry or induration.  Musculoskeletal: Normal range of motion. He exhibits no edema or tenderness.  Neurological: He is alert and oriented to person, place, and time. No cranial nerve deficit. He exhibits normal muscle tone. Coordination normal.  Speech normal, gait unassisted and normal for age, motor strength appropriate for age   Skin: Skin is warm and dry. No pallor.  No jaundice  Psychiatric: He has a normal mood and affect. His behavior is normal. Judgment and thought content normal.  Vitals reviewed.        Assessment & Plan:   Problem List Items Addressed This Visit      Other   Annual physical exam - Primary    Td 2007 Flu shot discussed Labs  Has a FH of CAD ( father at age 73) DRE neg  , check a PSA  Interested in a colonoscopy, referral will be done.  Other issues: Hypothyroidism, reports good compliance with Synthroid, labs Fatigue, sleepy, has gained weight, lack of exercise: I did a Epworth sleepiness scale and scored 5 which is negative. Will check general labs, recommend to go back to routine exercise and let me know if he is not improving in the next 2 or 3 months. Also a healthy diet is recommended. Eye pain, see history of present illness and physical exam, recommend to see an ophthalmologist for further eval within a week. The patient states he will get that arranged. Also, 2 months ago developed a skin lesion at the right supraclavicular area, exam is confirmatory, has 34 mm skin lesion, hard, not hyperpigmented. Refer to dermatology       Relevant Orders   Comprehensive metabolic panel   CBC with Differential/Platelet   TSH   Lipid panel   PSA   Vitamin B12   Vit D  25 hydroxy (rtn osteoporosis monitoring)   Folate

## 2014-10-12 ENCOUNTER — Other Ambulatory Visit (INDEPENDENT_AMBULATORY_CARE_PROVIDER_SITE_OTHER): Payer: BLUE CROSS/BLUE SHIELD

## 2014-10-12 DIAGNOSIS — Z0000 Encounter for general adult medical examination without abnormal findings: Secondary | ICD-10-CM

## 2014-10-12 LAB — LIPID PANEL
CHOL/HDL RATIO: 5
Cholesterol: 180 mg/dL (ref 0–200)
HDL: 39.3 mg/dL (ref >39.00)
LDL CALC: 118 mg/dL — AB (ref 0–99)
NonHDL: 140.7
Triglycerides: 112 mg/dL (ref 0.0–149.0)
VLDL: 22.4 mg/dL (ref 0.0–40.0)

## 2014-10-12 LAB — COMPREHENSIVE METABOLIC PANEL
ALT: 33 U/L (ref 0–53)
AST: 25 U/L (ref 0–37)
Albumin: 3.9 g/dL (ref 3.5–5.2)
Alkaline Phosphatase: 69 U/L (ref 39–117)
BUN: 15 mg/dL (ref 6–23)
CO2: 27 mEq/L (ref 19–32)
Calcium: 8.7 mg/dL (ref 8.4–10.5)
Chloride: 109 mEq/L (ref 96–112)
Creatinine, Ser: 1.14 mg/dL (ref 0.40–1.50)
GFR: 71.24 mL/min (ref >60.00)
Glucose, Bld: 110 mg/dL — ABNORMAL HIGH (ref 70–99)
Potassium: 4.1 mEq/L (ref 3.5–5.1)
SODIUM: 140 meq/L (ref 135–145)
TOTAL PROTEIN: 6.5 g/dL (ref 6.0–8.3)
Total Bilirubin: 0.4 mg/dL (ref 0.2–1.2)

## 2014-10-12 LAB — CBC WITH DIFFERENTIAL/PLATELET
BASOS ABS: 0 10*3/uL (ref 0.0–0.1)
Basophils Relative: 0.3 % (ref 0.0–3.0)
Eosinophils Absolute: 0 10*3/uL (ref 0.0–0.7)
Eosinophils Relative: 0.8 % (ref 0.0–5.0)
HEMATOCRIT: 40.6 % (ref 39.0–52.0)
Hemoglobin: 13.4 g/dL (ref 13.0–17.0)
LYMPHS ABS: 1.4 10*3/uL (ref 0.7–4.0)
Lymphocytes Relative: 34.7 % (ref 12.0–46.0)
MCHC: 32.9 g/dL (ref 30.0–36.0)
MCV: 84.6 fl (ref 78.0–100.0)
MONO ABS: 0.4 10*3/uL (ref 0.1–1.0)
Monocytes Relative: 9.1 % (ref 3.0–12.0)
NEUTROS PCT: 55.1 % (ref 43.0–77.0)
Neutro Abs: 2.2 10*3/uL (ref 1.4–7.7)
Platelets: 301 10*3/uL (ref 150.0–400.0)
RBC: 4.8 Mil/uL (ref 4.22–5.81)
RDW: 13.5 % (ref 11.5–15.5)
WBC: 4 10*3/uL (ref 4.0–10.5)

## 2014-10-12 LAB — PSA: PSA: 0.4 ng/mL (ref 0.10–4.00)

## 2014-10-12 LAB — VITAMIN D 25 HYDROXY (VIT D DEFICIENCY, FRACTURES): VITD: 17.43 ng/mL — AB (ref 30.00–100.00)

## 2014-10-12 LAB — TSH: TSH: 0.4 u[IU]/mL (ref 0.35–4.50)

## 2014-10-12 LAB — VITAMIN B12: Vitamin B-12: 499 pg/mL (ref 211–911)

## 2014-10-12 LAB — FOLATE: Folate: 21.7 ng/mL (ref >5.9)

## 2014-10-13 ENCOUNTER — Other Ambulatory Visit (INDEPENDENT_AMBULATORY_CARE_PROVIDER_SITE_OTHER): Payer: BLUE CROSS/BLUE SHIELD

## 2014-10-13 DIAGNOSIS — R739 Hyperglycemia, unspecified: Secondary | ICD-10-CM

## 2014-10-13 LAB — HEMOGLOBIN A1C: HEMOGLOBIN A1C: 6.1 % (ref 4.6–6.5)

## 2014-10-18 ENCOUNTER — Other Ambulatory Visit: Payer: Self-pay

## 2014-10-18 MED ORDER — LEVOTHYROXINE SODIUM 137 MCG PO TABS: ORAL_TABLET | ORAL | Status: DC

## 2014-10-18 MED ORDER — VITAMIN D (ERGOCALCIFEROL) 1.25 MG (50000 UNIT) PO CAPS: 50000.0000 [IU] | ORAL_CAPSULE | ORAL | Status: DC

## 2014-10-18 NOTE — Addendum Note (Signed)
Addended by: Wilfrid Lund on: 10/18/2014 02:38 PM   Modules accepted: Orders

## 2014-11-07 ENCOUNTER — Encounter: Payer: Self-pay | Admitting: Internal Medicine

## 2015-05-16 ENCOUNTER — Other Ambulatory Visit: Payer: Self-pay | Admitting: Internal Medicine

## 2015-06-21 ENCOUNTER — Encounter: Payer: Self-pay | Admitting: Internal Medicine

## 2015-06-22 MED ORDER — LEVOTHYROXINE SODIUM 137 MCG PO TABS: 137.0000 ug | ORAL_TABLET | Freq: Every day | ORAL | Status: DC

## 2015-06-22 NOTE — Telephone Encounter (Signed)
15 tablets sent to [facility] on [address].

## 2015-06-22 NOTE — Telephone Encounter (Signed)
appt scheduled for 07/04/15. Pt is out of thryoid meds. Please send in at least to get him to appt date. [facility] [place], [place]

## 2015-07-03 ENCOUNTER — Encounter: Payer: Self-pay | Admitting: Internal Medicine

## 2015-07-03 ENCOUNTER — Ambulatory Visit (HOSPITAL_BASED_OUTPATIENT_CLINIC_OR_DEPARTMENT_OTHER)
Admission: RE | Admit: 2015-07-03 | Discharge: 2015-07-03 | Disposition: A | Discharge status: Home / Self Care | Payer: BLUE CROSS/BLUE SHIELD | Source: Ambulatory Visit | Attending: Internal Medicine | Admitting: Internal Medicine

## 2015-07-03 ENCOUNTER — Ambulatory Visit (INDEPENDENT_AMBULATORY_CARE_PROVIDER_SITE_OTHER): Payer: BLUE CROSS/BLUE SHIELD | Admitting: Internal Medicine

## 2015-07-03 VITALS — BP 118/82 | HR 65 | Temp 97.6°F | Ht 73.0 in | Wt 237.0 lb

## 2015-07-03 DIAGNOSIS — R7989 Other specified abnormal findings of blood chemistry: Secondary | ICD-10-CM | POA: Diagnosis not present

## 2015-07-03 DIAGNOSIS — E039 Hypothyroidism, unspecified: Secondary | ICD-10-CM

## 2015-07-03 DIAGNOSIS — R0602 Shortness of breath: Secondary | ICD-10-CM | POA: Diagnosis present

## 2015-07-03 DIAGNOSIS — Z114 Encounter for screening for human immunodeficiency virus [HIV]: Secondary | ICD-10-CM

## 2015-07-03 DIAGNOSIS — R202 Paresthesia of skin: Secondary | ICD-10-CM

## 2015-07-03 DIAGNOSIS — R0609 Other forms of dyspnea: Secondary | ICD-10-CM | POA: Diagnosis not present

## 2015-07-03 DIAGNOSIS — Z09 Encounter for follow-up examination after completed treatment for conditions other than malignant neoplasm: Secondary | ICD-10-CM

## 2015-07-03 DIAGNOSIS — Z1159 Encounter for screening for other viral diseases: Secondary | ICD-10-CM

## 2015-07-03 LAB — CBC WITH DIFFERENTIAL/PLATELET
BASOS ABS: 0 10*3/uL (ref 0.0–0.1)
Basophils Relative: 0.4 % (ref 0.0–3.0)
EOS PCT: 0.8 % (ref 0.0–5.0)
Eosinophils Absolute: 0 10*3/uL (ref 0.0–0.7)
HEMATOCRIT: 39.7 % (ref 39.0–52.0)
Hemoglobin: 13.1 g/dL (ref 13.0–17.0)
LYMPHS PCT: 36.8 % (ref 12.0–46.0)
Lymphs Abs: 1.7 10*3/uL (ref 0.7–4.0)
MCHC: 33 g/dL (ref 30.0–36.0)
MCV: 85.6 fl (ref 78.0–100.0)
MONOS PCT: 9.4 % (ref 3.0–12.0)
Monocytes Absolute: 0.4 10*3/uL (ref 0.1–1.0)
NEUTROS ABS: 2.5 10*3/uL (ref 1.4–7.7)
Neutrophils Relative %: 52.6 % (ref 43.0–77.0)
Platelets: 309 10*3/uL (ref 150.0–400.0)
RBC: 4.64 Mil/uL (ref 4.22–5.81)
RDW: 14 % (ref 11.5–15.5)
WBC: 4.7 10*3/uL (ref 4.0–10.5)

## 2015-07-03 LAB — FOLATE: Folate: >23.8 ng/mL (ref >5.9)

## 2015-07-03 LAB — BASIC METABOLIC PANEL
BUN: 16 mg/dL (ref 6–23)
CALCIUM: 9.1 mg/dL (ref 8.4–10.5)
CO2: 25 meq/L (ref 19–32)
Chloride: 107 mEq/L (ref 96–112)
Creatinine, Ser: 1 mg/dL (ref 0.40–1.50)
GFR: 82.64 mL/min (ref >60.00)
Glucose, Bld: 103 mg/dL — ABNORMAL HIGH (ref 70–99)
Potassium: 4.7 mEq/L (ref 3.5–5.1)
SODIUM: 140 meq/L (ref 135–145)

## 2015-07-03 LAB — TSH: TSH: 2.19 u[IU]/mL (ref 0.35–4.50)

## 2015-07-03 NOTE — Patient Instructions (Signed)
Get your blood work before you leave   Stop by the first floor and get the XR     Take vitamin D 1000  units every day  Next visit  for a physical exam in 6 months, sooner if needed.       Please schedule an appointment at the front desk Please come back fasting

## 2015-07-03 NOTE — Progress Notes (Signed)
Subjective:    Patient ID: Brad Stewart, male    DOB: 1961-02-03, 54 y.o.   MRN: [id number]  DOS:  07/03/2015 Type of visit - description : Routine checkup Interval history: Hypothyroidism, good compliance w/medication. Vitamin D deficiency: Status post ergocalciferol, he felt subjectively better after the replacement. He has a couple of other concerns: For the last 2 months he has noted dyspnea on exertion, used to be able to go up two  or 3 floors without problems and now he feels mildly/moderately short of breath when he does.  Also, on and off numbness, discomfort at the distal feet R>L, no rash. Worse when he is active likes playing golf. No back pain.   Review of Systems Denies chest pain, lower extremity edema or palpitations. No GERD type of symptoms He takes a airplane trip for business approximately once a month, has never experienced pain in the calves or swelling of the legs.  Past Medical History  Diagnosis Date  . Hypothyroidism     Past Surgical History  Procedure Laterality Date  . Shoulder arthroscopy Left     Social History   Social History  . Marital Status: Married    Spouse Name: N/A  . Number of Children: 2  . Years of Education: N/A   Occupational History  . banker     Social History Main Topics  . Smoking status: Never Smoker   . Smokeless tobacco: Never Used  . Alcohol Use: 0.0 oz/week    0 Standard drinks or equivalent per week     Comment: socially   . Drug Use: No  . Sexual Activity: Not on file   Other Topics Concern  . Not on file   Social History Narrative   Household-- pt and wife , 2 adult children        Medication List       This list is accurate as of: 07/03/15 11:59 PM.  Always use your most recent med list.               levothyroxine 137 MCG tablet  Commonly known as:  SYNTHROID, LEVOTHROID  Take 1 tablet (137 mcg total) by mouth daily before breakfast.     valACYclovir 1000 MG tablet  Commonly  known as:  VALTREX  2 tabs by mouth with the onset of cold sores, repeat dose 12 hours later. Only a total of 4 tablets per episode     Vitamin D (Ergocalciferol) 50000 UNITS Caps capsule  Commonly known as:  DRISDOL  Take 1 capsule (50,000 Units total) by mouth every 7 (seven) days.           Objective:   Physical Exam BP 118/82 mmHg  Pulse 65  Temp(Src) 97.6 F (36.4 C) (Oral)  Ht 6\' 1"  (1.854 m)  Wt 237 lb (107.502 kg)  BMI 31.27 kg/m2  SpO2 96% General:   Well developed, well nourished . NAD.  HEENT:  Normocephalic . Face symmetric, atraumatic Lungs:  CTA B Normal respiratory effort, no intercostal retractions, no accessory muscle use. Heart: RRR,  no murmur.  no pretibial edema bilaterally  Abdomen:  Not distended, soft, non-tender. No rebound or rigidity. No mass,organomegaly Lower extremities: No edema, good pedal pulses, normal pinprick examination distally. Skin: Not pale. Not jaundice Neurologic:  alert & oriented X3.  Speech normal, gait appropriate for age and unassisted. DTRs symmetric  Psych--  Cognition and judgment appear intact.  Cooperative with normal attention span and concentration.  Behavior appropriate.  No anxious or depressed appearing.    Assessment & Plan:   Assessment> Prediabetes- 10-2014 ---> A1c  6.1 Hypothyroidism Vitamin D deficiency  Plan Prediabetes: DX discussed, recheck the A1c, diet and exercise recommended Hypothyroidism: Check labs Vitamin D deficiency: Status post ergocalciferol, multivitamins, recheck labs. Start vitamin D 1000 u daily Paresthesias: Neuropathy? Entrapment neuropathy (symptoms increase with walking) ?Marland Kitchen Recent B12 normal, check a folic acid. Reassess in few months Dyspnea on exertion: EKG today sinus rhythm, no acute changes. I am somewhat concerned about this new symptom:  refer to cards , stress test? Also  he is a frequent flyer --->  will check a d-dimer

## 2015-07-03 NOTE — Progress Notes (Signed)
Pre visit review using our clinic review tool, if applicable. No additional management support is needed unless otherwise documented below in the visit note. 

## 2015-07-04 DIAGNOSIS — Z09 Encounter for follow-up examination after completed treatment for conditions other than malignant neoplasm: Secondary | ICD-10-CM | POA: Insufficient documentation

## 2015-07-04 LAB — HEPATITIS C ANTIBODY: HCV Ab: REACTIVE — AB

## 2015-07-04 LAB — HIV ANTIBODY (ROUTINE TESTING W REFLEX): HIV: NONREACTIVE

## 2015-07-04 MED ORDER — LEVOTHYROXINE SODIUM 137 MCG PO TABS: 137.0000 ug | ORAL_TABLET | Freq: Every day | ORAL | Status: DC

## 2015-07-04 NOTE — Assessment & Plan Note (Signed)
Prediabetes: DX discussed, recheck the A1c, diet and exercise recommended Hypothyroidism: Check labs Vitamin D deficiency: Status post ergocalciferol, multivitamins, recheck labs. Start vitamin D 1000 u daily Paresthesias: Neuropathy? Entrapment neuropathy (symptoms increase with walking) ?Marland Kitchen Recent B12 normal, check a folic acid. Reassess in few months Dyspnea on exertion: EKG today sinus rhythm, no acute changes. I am somewhat concerned about this new symptom:  refer to cards , stress test? Also  he is a frequent flyer --->  will check a d-dimer

## 2015-07-05 ENCOUNTER — Telehealth: Payer: Self-pay | Admitting: Internal Medicine

## 2015-07-05 NOTE — Telephone Encounter (Signed)
Transferred to [facility] in lab.

## 2015-07-05 NOTE — Telephone Encounter (Signed)
Verified specimen with Lab tech at [facility]...KMP

## 2015-07-05 NOTE — Telephone Encounter (Signed)
Caller name: Randell Loop  (Lab)   Can be reached: [phone] option 2   Reason for call: She called in requesting a call back from a nurse. She says the nurse can speak to anyone there in regards to this pt.

## 2015-07-06 LAB — VITAMIN D 1,25 DIHYDROXY
VITAMIN D3 1, 25 (OH): 59 pg/mL
Vitamin D 1, 25 (OH)2 Total: 59 pg/mL (ref 18–72)
Vitamin D2 1, 25 (OH)2: <8 pg/mL

## 2015-07-06 LAB — D-DIMER, QUANTITATIVE (NOT AT ARMC): D DIMER QUANT: 0.27 ug{FEU}/mL (ref 0.00–0.48)

## 2015-07-10 LAB — HEPATITIS C RNA QUANTITATIVE: HCV QUANT: NOT DETECTED [IU]/mL (ref <15)

## 2015-07-13 ENCOUNTER — Telehealth: Payer: Self-pay

## 2015-07-13 NOTE — Telephone Encounter (Signed)
-----   Message from Colon Branch, MD sent at 07/13/2015  1:31 PM EST ----- Regarding: Send a letter The cardiology office has been unable to contact you about the referral, as you recall during the last visit you said that you are short of breath with some activities, I think is important you see the cardiologist to be sure it is nothing related to your heart. Please contact the cardiology office at your earliest convenience. If you're unable to contact them, please call this office

## 2015-07-13 NOTE — Telephone Encounter (Signed)
Letter printed and mailed to Pt.  

## 2016-01-27 ENCOUNTER — Other Ambulatory Visit: Payer: Self-pay | Admitting: Internal Medicine

## 2016-03-28 ENCOUNTER — Other Ambulatory Visit: Payer: Self-pay | Admitting: Internal Medicine

## 2016-04-26 ENCOUNTER — Other Ambulatory Visit: Payer: Self-pay | Admitting: Internal Medicine

## 2016-05-11 ENCOUNTER — Other Ambulatory Visit: Payer: Self-pay | Admitting: Internal Medicine

## 2016-05-14 ENCOUNTER — Other Ambulatory Visit: Payer: Self-pay | Admitting: Internal Medicine

## 2016-05-14 MED ORDER — LEVOTHYROXINE SODIUM 137 MCG PO TABS: 137.0000 ug | ORAL_TABLET | Freq: Every day | ORAL | 0 refills | Status: DC

## 2016-05-14 NOTE — Telephone Encounter (Signed)
30 day supply sent. Pt knows to call and schedule appt when he returns from vacation.

## 2016-05-14 NOTE — Telephone Encounter (Signed)
Last ov 07/03/15. Last fill 04/29/16 #15 0 refill. Pt file noted that pt needs further evaluation and/or laboratory testing before refills are given. Pt needs to make an appointment. I attempted to call pt but was unable to leave message.

## 2016-06-12 ENCOUNTER — Other Ambulatory Visit: Payer: Self-pay | Admitting: Internal Medicine

## 2016-06-19 MED ORDER — LEVOTHYROXINE SODIUM 137 MCG PO TABS: 137.0000 ug | ORAL_TABLET | Freq: Every day | ORAL | 0 refills | Status: DC

## 2016-07-02 ENCOUNTER — Other Ambulatory Visit: Payer: Self-pay | Admitting: Internal Medicine

## 2016-07-02 DIAGNOSIS — E039 Hypothyroidism, unspecified: Secondary | ICD-10-CM

## 2016-07-04 NOTE — Telephone Encounter (Signed)
Please advise 

## 2016-07-04 NOTE — Telephone Encounter (Signed)
He comes to the office infrequently, only takes Synthroid. Plan: Schedule a TSH within the next few days Okay to refill synthroid for one month Schedule a CPX within the next 3-4  months.

## 2016-07-05 MED ORDER — LEVOTHYROXINE SODIUM 137 MCG PO TABS: 137.0000 ug | ORAL_TABLET | Freq: Every day | ORAL | 0 refills | Status: DC

## 2016-07-05 NOTE — Telephone Encounter (Signed)
Cancellation for CPE today at 3:30. Having scheduler call to see if he can make appt. MyChart message also sent w/ information that he will receive call and if unable to make CPE appt today to come in 2-3 weeks to have TSH checked since he had ran out. TSH ordered.

## 2016-07-18 ENCOUNTER — Other Ambulatory Visit (INDEPENDENT_AMBULATORY_CARE_PROVIDER_SITE_OTHER): Payer: BLUE CROSS/BLUE SHIELD

## 2016-07-18 DIAGNOSIS — E039 Hypothyroidism, unspecified: Secondary | ICD-10-CM

## 2016-07-18 LAB — TSH: TSH: 3.43 u[IU]/mL (ref 0.35–4.50)

## 2016-07-18 MED ORDER — LEVOTHYROXINE SODIUM 137 MCG PO TABS: 137.0000 ug | ORAL_TABLET | Freq: Every day | ORAL | 5 refills | Status: DC

## 2016-07-18 NOTE — Addendum Note (Signed)
Addended byDamita Dunnings D on: 07/18/2016 03:08 PM   Modules accepted: Orders

## 2016-08-05 ENCOUNTER — Other Ambulatory Visit: Payer: Self-pay | Admitting: Internal Medicine

## 2016-09-10 ENCOUNTER — Ambulatory Visit (INDEPENDENT_AMBULATORY_CARE_PROVIDER_SITE_OTHER): Payer: BLUE CROSS/BLUE SHIELD | Admitting: Internal Medicine

## 2016-09-10 ENCOUNTER — Encounter: Payer: Self-pay | Admitting: Internal Medicine

## 2016-09-10 VITALS — BP 116/74 | HR 76 | Temp 98.2°F | Resp 14 | Ht 73.0 in | Wt 233.0 lb

## 2016-09-10 DIAGNOSIS — Z23 Encounter for immunization: Secondary | ICD-10-CM | POA: Diagnosis not present

## 2016-09-10 DIAGNOSIS — R739 Hyperglycemia, unspecified: Secondary | ICD-10-CM

## 2016-09-10 DIAGNOSIS — Z1211 Encounter for screening for malignant neoplasm of colon: Secondary | ICD-10-CM

## 2016-09-10 DIAGNOSIS — Z0000 Encounter for general adult medical examination without abnormal findings: Secondary | ICD-10-CM

## 2016-09-10 NOTE — Patient Instructions (Signed)
  GO TO THE FRONT DESK Schedule labs to be done tomorrow or the next day, fasting   Schedule your next appointment for a  physical exam in one year   Call for refills if needed

## 2016-09-10 NOTE — Assessment & Plan Note (Signed)
Prediabetes: Doing great with diet and exercise, checking labs. Hypothyroidism: On Synthroid, checking labs. May need to check labs in 6 months. Vitamin D deficiency: Takes 1000 units daily. Checking labs RTC one year.

## 2016-09-10 NOTE — Assessment & Plan Note (Addendum)
Td 09-2016 Flu shot declined    Has a FH of CAD ( father at age 56), pt is asx Last DRE wnl, PSA  2016 (-), reassess  Interested in a colonoscopy, last referral failed, we'll try again. Doing great with diet and exercise Labs: CMP, FLP, CBC, A1c, TSH, vitamin D.

## 2016-09-10 NOTE — Progress Notes (Signed)
Subjective:    Patient ID: Brad Stewart, male    DOB: 1961-01-29, 56 y.o.   MRN: [id number]  DOS:  09/10/2016 Type of visit - description : CPX Interval history: Last year was very stressful at work, in the last few weeks stress has decrease, he is feeling great, going to the gym again, exercising regularly, watching his diet.   Review of Systems Constitutional: No fever. No chills. No unexplained wt changes. No unusual sweats  HEENT: No dental problems, no ear discharge, no facial swelling, no voice changes. No eye discharge, no eye  redness , no  intolerance to light   Respiratory: No wheezing , no  difficulty breathing. No cough , no mucus production  Cardiovascular: No CP, no leg swelling , no  Palpitations  GI: no nausea, no vomiting, no diarrhea , no  abdominal pain.  No blood in the stools. No dysphagia, no odynophagia    Endocrine: No polyphagia, no polyuria , no polydipsia  GU: No dysuria, gross hematuria, difficulty urinating. No urinary urgency, no frequency.  Musculoskeletal: No joint swellings or unusual aches or pains  Skin: No change in the color of the skin, palor , no  Rash  Allergic, immunologic: No environmental allergies , no  food allergies  Neurological: No dizziness no  syncope. No headaches. No diplopia, no slurred, no slurred speech, no motor deficits, no facial  Numbness  Hematological: No enlarged lymph nodes, no easy bruising , no unusual bleedings  Psychiatry: No suicidal ideas, no hallucinations, no beavior problems, no confusion.  No unusual/severe anxiety, no depression   Past Medical History:  Diagnosis Date  . Hypothyroidism     Past Surgical History:  Procedure Laterality Date  . SHOULDER ARTHROSCOPY Left     Social History   Social History  . Marital status: Married    Spouse name: N/A  . Number of children: 2  . Years of education: N/A   Occupational History  . banker     Social History Main Topics  . Smoking  status: Never Smoker  . Smokeless tobacco: Never Used  . Alcohol use 0.0 oz/week     Comment: socially   . Drug use: No  . Sexual activity: Not on file   Other Topics Concern  . Not on file   Social History Narrative   Household-- pt and wife , 2 adult children     Family History  Problem Relation Age of Onset  . Coronary artery disease Father 5    dx age 68, had CABG  . Bladder Cancer Father   . Diabetes Other     grandmother, F (late onset)  . Colon cancer Neg Hx   . Prostate cancer Neg Hx   . Stroke Neg Hx      Allergies as of 09/10/2016      Reactions   Sulfonamide Derivatives    REACTION: n/v      Medication List       Accurate as of 09/10/16  5:23 PM. Always use your most recent med list.          levothyroxine 137 MCG tablet Commonly known as:  SYNTHROID, LEVOTHROID Take 1 tablet (137 mcg total) by mouth daily before breakfast.   valACYclovir 1000 MG tablet Commonly known as:  VALTREX 2 tabs by mouth with the onset of cold sores, repeat dose 12 hours later. Only a total of 4 tablets per episode          Objective:  Physical Exam BP 116/74 (BP Location: Left Arm, Patient Position: Sitting, Cuff Size: Normal)   Pulse 76   Temp 98.2 F (36.8 C) (Oral)   Resp 14   Ht 6\' 1"  (1.854 m)   Wt 233 lb (105.7 kg)   SpO2 98%   BMI 30.74 kg/m   General:   Well developed, well nourished . NAD.  Neck: No  thyromegaly  HEENT:  Normocephalic . Face symmetric, atraumatic Lungs:  CTA B Normal respiratory effort, no intercostal retractions, no accessory muscle use. Heart: RRR,  no murmur.  No pretibial edema bilaterally  Abdomen:  Not distended, soft, non-tender. No rebound or rigidity.   Skin: Exposed areas without rash. Not pale. Not jaundice Neurologic:  alert & oriented X3.  Speech normal, gait appropriate for age and unassisted Strength symmetric and appropriate for age.  Psych: Cognition and judgment appear intact.  Cooperative with normal  attention span and concentration.  Behavior appropriate. No anxious or depressed appearing.    Assessment & Plan:    Assessment> Prediabetes- 10-2014 ---> A1c  6.1 Hypothyroidism Vitamin D deficiency Cold sores   PLAN: Prediabetes: Doing great with diet and exercise, checking labs. Hypothyroidism: On Synthroid, checking labs. May need to check labs in 6 months. Vitamin D deficiency: Takes 1000 units daily. Checking labs RTC one year.

## 2016-09-10 NOTE — Progress Notes (Signed)
Pre visit review using our clinic review tool, if applicable. No additional management support is needed unless otherwise documented below in the visit note. 

## 2016-09-11 ENCOUNTER — Other Ambulatory Visit (INDEPENDENT_AMBULATORY_CARE_PROVIDER_SITE_OTHER): Payer: BLUE CROSS/BLUE SHIELD

## 2016-09-11 DIAGNOSIS — R739 Hyperglycemia, unspecified: Secondary | ICD-10-CM

## 2016-09-11 DIAGNOSIS — Z0000 Encounter for general adult medical examination without abnormal findings: Secondary | ICD-10-CM

## 2016-09-11 LAB — CBC WITH DIFFERENTIAL/PLATELET
BASOS ABS: 0 10*3/uL (ref 0.0–0.1)
Basophils Relative: 0.3 % (ref 0.0–3.0)
EOS PCT: 0.4 % (ref 0.0–5.0)
Eosinophils Absolute: 0 10*3/uL (ref 0.0–0.7)
HEMATOCRIT: 41.3 % (ref 39.0–52.0)
HEMOGLOBIN: 14 g/dL (ref 13.0–17.0)
LYMPHS ABS: 1.5 10*3/uL (ref 0.7–4.0)
LYMPHS PCT: 28.9 % (ref 12.0–46.0)
MCHC: 33.8 g/dL (ref 30.0–36.0)
MCV: 85.8 fl (ref 78.0–100.0)
MONOS PCT: 9.7 % (ref 3.0–12.0)
Monocytes Absolute: 0.5 10*3/uL (ref 0.1–1.0)
NEUTROS PCT: 60.7 % (ref 43.0–77.0)
Neutro Abs: 3.2 10*3/uL (ref 1.4–7.7)
Platelets: 296 10*3/uL (ref 150.0–400.0)
RBC: 4.82 Mil/uL (ref 4.22–5.81)
RDW: 13.8 % (ref 11.5–15.5)
WBC: 5.3 10*3/uL (ref 4.0–10.5)

## 2016-09-11 LAB — COMPREHENSIVE METABOLIC PANEL
ALT: 28 U/L (ref 0–53)
AST: 27 U/L (ref 0–37)
Albumin: 4.3 g/dL (ref 3.5–5.2)
Alkaline Phosphatase: 60 U/L (ref 39–117)
BILIRUBIN TOTAL: 0.6 mg/dL (ref 0.2–1.2)
BUN: 20 mg/dL (ref 6–23)
CO2: 27 meq/L (ref 19–32)
CREATININE: 1.11 mg/dL (ref 0.40–1.50)
Calcium: 9.2 mg/dL (ref 8.4–10.5)
Chloride: 108 mEq/L (ref 96–112)
GFR: 72.94 mL/min (ref >60.00)
GLUCOSE: 105 mg/dL — AB (ref 70–99)
Potassium: 4.6 mEq/L (ref 3.5–5.1)
Sodium: 141 mEq/L (ref 135–145)
TOTAL PROTEIN: 7.2 g/dL (ref 6.0–8.3)

## 2016-09-11 LAB — HEMOGLOBIN A1C: Hgb A1c MFr Bld: 6.1 % (ref 4.6–6.5)

## 2016-09-11 LAB — LIPID PANEL
CHOL/HDL RATIO: 4
Cholesterol: 182 mg/dL (ref 0–200)
HDL: 44 mg/dL (ref >39.00)
LDL Cholesterol: 120 mg/dL — ABNORMAL HIGH (ref 0–99)
NONHDL: 138.17
Triglycerides: 91 mg/dL (ref 0.0–149.0)
VLDL: 18.2 mg/dL (ref 0.0–40.0)

## 2016-09-11 LAB — TSH: TSH: 1.93 u[IU]/mL (ref 0.35–4.50)

## 2016-09-16 LAB — VITAMIN D 1,25 DIHYDROXY
VITAMIN D 1, 25 (OH) TOTAL: 65 pg/mL (ref 18–72)
VITAMIN D3 1, 25 (OH): 65 pg/mL

## 2016-10-11 ENCOUNTER — Encounter: Payer: Self-pay | Admitting: Internal Medicine

## 2017-01-20 ENCOUNTER — Other Ambulatory Visit: Payer: Self-pay | Admitting: Internal Medicine

## 2017-05-19 ENCOUNTER — Other Ambulatory Visit: Payer: Self-pay | Admitting: Internal Medicine

## 2017-06-24 ENCOUNTER — Encounter: Payer: Self-pay | Admitting: Internal Medicine

## 2017-10-14 ENCOUNTER — Other Ambulatory Visit: Payer: Self-pay

## 2017-10-14 MED ORDER — LEVOTHYROXINE SODIUM 137 MCG PO TABS: 137.0000 ug | ORAL_TABLET | Freq: Every day | ORAL | 0 refills | Status: DC

## 2017-10-16 ENCOUNTER — Other Ambulatory Visit: Payer: Self-pay | Admitting: Internal Medicine

## 2017-10-16 NOTE — Telephone Encounter (Signed)
Received refill request for levothyroxine 158mcg tablets today. However, medication was refilled yesterday, 10/15/2017.

## 2018-01-06 ENCOUNTER — Other Ambulatory Visit: Payer: Self-pay | Admitting: Internal Medicine

## 2018-02-19 ENCOUNTER — Encounter: Payer: Self-pay | Admitting: Internal Medicine

## 2018-02-19 DIAGNOSIS — E039 Hypothyroidism, unspecified: Secondary | ICD-10-CM

## 2018-02-19 MED ORDER — LEVOTHYROXINE SODIUM 137 MCG PO TABS: 137.0000 ug | ORAL_TABLET | Freq: Every day | ORAL | 0 refills | Status: DC

## 2018-02-26 ENCOUNTER — Other Ambulatory Visit (INDEPENDENT_AMBULATORY_CARE_PROVIDER_SITE_OTHER): Payer: BLUE CROSS/BLUE SHIELD

## 2018-02-26 DIAGNOSIS — E039 Hypothyroidism, unspecified: Secondary | ICD-10-CM | POA: Diagnosis not present

## 2018-02-26 NOTE — Addendum Note (Signed)
Addended by: Caffie Pinto on: 02/26/2018 01:46 PM   Modules accepted: Orders

## 2018-02-27 ENCOUNTER — Other Ambulatory Visit (INDEPENDENT_AMBULATORY_CARE_PROVIDER_SITE_OTHER): Payer: BLUE CROSS/BLUE SHIELD

## 2018-02-27 ENCOUNTER — Telehealth: Payer: Self-pay | Admitting: Emergency Medicine

## 2018-02-27 DIAGNOSIS — E039 Hypothyroidism, unspecified: Secondary | ICD-10-CM | POA: Diagnosis not present

## 2018-02-27 LAB — TSH: TSH: 5.28 u[IU]/mL — AB (ref 0.35–4.50)

## 2018-02-27 NOTE — Telephone Encounter (Signed)
Pt is coming back in this am.

## 2018-02-27 NOTE — Telephone Encounter (Signed)
Called and left VM for patient to call and have lab appt made. We need to recollect blood for TSH from 02-26-18.

## 2018-03-03 ENCOUNTER — Encounter: Payer: Self-pay | Admitting: Internal Medicine

## 2018-03-03 MED ORDER — LEVOTHYROXINE SODIUM 150 MCG PO TABS: 150.0000 ug | ORAL_TABLET | Freq: Every day | ORAL | 2 refills | Status: DC

## 2018-03-26 ENCOUNTER — Other Ambulatory Visit: Payer: Self-pay | Admitting: Internal Medicine

## 2018-06-28 ENCOUNTER — Other Ambulatory Visit: Payer: Self-pay | Admitting: Internal Medicine

## 2018-07-24 ENCOUNTER — Other Ambulatory Visit: Payer: Self-pay | Admitting: Internal Medicine

## 2018-08-21 ENCOUNTER — Encounter: Payer: Self-pay | Admitting: Internal Medicine

## 2018-08-21 ENCOUNTER — Other Ambulatory Visit: Payer: Self-pay | Admitting: Internal Medicine

## 2018-08-21 MED ORDER — LEVOTHYROXINE SODIUM 150 MCG PO TABS: 150.0000 ug | ORAL_TABLET | Freq: Every day | ORAL | 0 refills | Status: DC

## 2018-09-08 ENCOUNTER — Encounter: Payer: Self-pay | Admitting: Internal Medicine

## 2018-09-08 ENCOUNTER — Ambulatory Visit (INDEPENDENT_AMBULATORY_CARE_PROVIDER_SITE_OTHER): Payer: BLUE CROSS/BLUE SHIELD | Admitting: Internal Medicine

## 2018-09-08 ENCOUNTER — Ambulatory Visit: Payer: BLUE CROSS/BLUE SHIELD | Admitting: Internal Medicine

## 2018-09-08 VITALS — BP 128/78 | HR 70 | Temp 97.9°F | Resp 18 | Ht 73.0 in | Wt 235.4 lb

## 2018-09-08 DIAGNOSIS — E039 Hypothyroidism, unspecified: Secondary | ICD-10-CM

## 2018-09-08 DIAGNOSIS — R739 Hyperglycemia, unspecified: Secondary | ICD-10-CM | POA: Diagnosis not present

## 2018-09-08 DIAGNOSIS — Z125 Encounter for screening for malignant neoplasm of prostate: Secondary | ICD-10-CM | POA: Diagnosis not present

## 2018-09-08 DIAGNOSIS — Z8639 Personal history of other endocrine, nutritional and metabolic disease: Secondary | ICD-10-CM

## 2018-09-08 NOTE — Progress Notes (Signed)
Pre visit review using our clinic review tool, if applicable. No additional management support is needed unless otherwise documented below in the visit note. 

## 2018-09-08 NOTE — Patient Instructions (Signed)
GO TO THE LAB : Get the blood work     GO TO THE FRONT DESK Schedule your next appointment for a physical exam in 6 months, fasting  You are due for a colonoscopy, let me know if I help you set that up.

## 2018-09-08 NOTE — Progress Notes (Signed)
Subjective:    Patient ID: Brad Stewart, male    DOB: [birth year], 58 y.o.   MRN: [id number]  DOS:  09/08/2018 Type of visit - description: Routine checkup Hypothyroidism: Good compliance with medication History of vitamin D deficiency: Taking 1000 units daily  Review of Systems  He remains very active Denies chest pain or difficulty breathing No dysuria, gross hematuria.  Past Medical History:  Diagnosis Date  . Hypothyroidism     Past Surgical History:  Procedure Laterality Date  . SHOULDER ARTHROSCOPY Left     Social History   Socioeconomic History  . Marital status: Married    Spouse name: Not on file  . Number of children: 2  . Years of education: Not on file  . Highest education level: Not on file  Occupational History  . Occupation: Designer, multimedia  . Financial resource strain: Not on file  . Food insecurity:    Worry: Not on file    Inability: Not on file  . Transportation needs:    Medical: Not on file    Non-medical: Not on file  Tobacco Use  . Smoking status: Never Smoker  . Smokeless tobacco: Never Used  Substance and Sexual Activity  . Alcohol use: Yes    Alcohol/week: 0.0 standard drinks    Comment: socially   . Drug use: No  . Sexual activity: Not on file  Lifestyle  . Physical activity:    Days per week: Not on file    Minutes per session: Not on file  . Stress: Not on file  Relationships  . Social connections:    Talks on phone: Not on file    Gets together: Not on file    Attends religious service: Not on file    Active member of club or organization: Not on file    Attends meetings of clubs or organizations: Not on file    Relationship status: Not on file  . Intimate partner violence:    Fear of current or ex partner: Not on file    Emotionally abused: Not on file    Physically abused: Not on file    Forced sexual activity: Not on file  Other Topics Concern  . Not on file  Social History Narrative   Household-- pt  and wife , 2 adult children      Allergies as of 09/08/2018      Reactions   Sulfonamide Derivatives    REACTION: n/v      Medication List       Accurate as of September 08, 2018  2:49 PM. Always use your most recent med list.        levothyroxine 150 MCG tablet Commonly known as:  SYNTHROID, LEVOTHROID Take 1 tablet (150 mcg total) by mouth daily before breakfast.           Objective:   Physical Exam BP 128/78 (BP Location: Left Arm, Patient Position: Sitting, Cuff Size: Normal)   Pulse 70   Temp 97.9 F (36.6 C) (Oral)   Resp 18   Ht 6\' 1"  (1.854 m)   Wt 235 lb 6.4 oz (106.8 kg)   SpO2 96%   BMI 31.06 kg/m  General:   Well developed, NAD, BMI noted. HEENT:  Normocephalic . Face symmetric, atraumatic Lungs:  CTA B Normal respiratory effort, no intercostal retractions, no accessory muscle use. Heart: RRR,  no murmur.  No pretibial edema bilaterally Abdomen: Soft, nontender nondistended DRE: Declined Skin: Not pale.  Not jaundice Neurologic:  alert & oriented X3.  Speech normal, gait appropriate for age and unassisted Psych--  Cognition and judgment appear intact.  Cooperative with normal attention span and concentration.  Behavior appropriate. No anxious or depressed appearing.      Assessment     Assessment   Prediabetes- 10-2014 ---> A1c  6.1 Hypothyroidism Vitamin D deficiency Cold sores   PLAN: Prediabetes: Doing great with lifestyle, check A1c and BMP Hypothyroidism: Good compliance with Synthroid, check a TSH Vitamin D deficiency: Taking 1000 units daily OTC.  Checking levels Preventive: Although were not doing a physical exam we discussed his preventive care. Declined a flu shot Needs cholesterol check due to [place].  Not fasting today. Prostate cancer screening: Declined a DRE, check a PSA Overdue for a CCS, offered GI referral, declined, states will coordinate through his job. RTC 6 months CPX

## 2018-09-09 LAB — TSH: TSH: 1.81 u[IU]/mL (ref 0.35–4.50)

## 2018-09-09 LAB — BASIC METABOLIC PANEL
BUN: 18 mg/dL (ref 6–23)
CALCIUM: 8.8 mg/dL (ref 8.4–10.5)
CO2: 26 mEq/L (ref 19–32)
CREATININE: 1.09 mg/dL (ref 0.40–1.50)
Chloride: 105 mEq/L (ref 96–112)
GFR: 73.96 mL/min (ref >60.00)
GLUCOSE: 83 mg/dL (ref 70–99)
Potassium: 4.6 mEq/L (ref 3.5–5.1)
Sodium: 139 mEq/L (ref 135–145)

## 2018-09-09 LAB — PSA: PSA: 0.43 ng/mL (ref 0.10–4.00)

## 2018-09-09 LAB — HEMOGLOBIN A1C: Hgb A1c MFr Bld: 6.1 % (ref 4.6–6.5)

## 2018-09-09 NOTE — Assessment & Plan Note (Signed)
Although were not doing a physical exam we discussed his preventive care. Declined a flu shot Needs cholesterol check due to [place].  Not fasting today. Prostate cancer screening: Declined a DRE, check a PSA Overdue for a CCS, offered GI referral, declined, states will coordinate through his job. RTC 6 months CPX

## 2018-09-09 NOTE — Assessment & Plan Note (Signed)
Prediabetes: Doing great with lifestyle, check A1c and BMP Hypothyroidism: Good compliance with Synthroid, check a TSH Vitamin D deficiency: Taking 1000 units daily OTC.  Checking levels RTC 6 months CPX

## 2018-09-11 MED ORDER — LEVOTHYROXINE SODIUM 150 MCG PO TABS: 150.0000 ug | ORAL_TABLET | Freq: Every day | ORAL | 1 refills | Status: DC

## 2018-09-11 NOTE — Addendum Note (Signed)
Addended byDamita Dunnings D on: 09/11/2018 12:48 PM   Modules accepted: Orders

## 2018-09-13 LAB — VITAMIN D 1,25 DIHYDROXY
VITAMIN D 1, 25 (OH) TOTAL: 53 pg/mL (ref 18–72)
Vitamin D2 1, 25 (OH)2: <8 pg/mL
Vitamin D3 1, 25 (OH)2: 53 pg/mL

## 2019-03-04 ENCOUNTER — Other Ambulatory Visit: Payer: Self-pay | Admitting: Internal Medicine

## 2019-06-03 ENCOUNTER — Other Ambulatory Visit: Payer: Self-pay | Admitting: Internal Medicine

## 2019-07-04 ENCOUNTER — Other Ambulatory Visit: Payer: Self-pay | Admitting: Internal Medicine

## 2019-07-28 ENCOUNTER — Encounter: Payer: Self-pay | Admitting: *Deleted

## 2019-07-28 ENCOUNTER — Other Ambulatory Visit: Payer: Self-pay | Admitting: Internal Medicine

## 2019-07-28 ENCOUNTER — Telehealth: Payer: Self-pay | Admitting: *Deleted

## 2019-07-28 NOTE — Telephone Encounter (Signed)
Refill request received.  Sent in one week supply because patient is past due for appointment.    No answer and vm was full could not leave message.  mychart message sent as well to patient.

## 2019-08-04 ENCOUNTER — Other Ambulatory Visit: Payer: Self-pay

## 2019-08-05 ENCOUNTER — Ambulatory Visit (INDEPENDENT_AMBULATORY_CARE_PROVIDER_SITE_OTHER): Payer: BC Managed Care – PPO | Admitting: Internal Medicine

## 2019-08-05 ENCOUNTER — Encounter: Payer: Self-pay | Admitting: Internal Medicine

## 2019-08-05 ENCOUNTER — Other Ambulatory Visit: Payer: Self-pay

## 2019-08-05 VITALS — BP 137/92 | HR 63 | Temp 96.8°F | Resp 16 | Ht 73.0 in | Wt 229.0 lb

## 2019-08-05 DIAGNOSIS — Z0000 Encounter for general adult medical examination without abnormal findings: Secondary | ICD-10-CM | POA: Diagnosis not present

## 2019-08-05 DIAGNOSIS — Z1211 Encounter for screening for malignant neoplasm of colon: Secondary | ICD-10-CM

## 2019-08-05 DIAGNOSIS — K4090 Unilateral inguinal hernia, without obstruction or gangrene, not specified as recurrent: Secondary | ICD-10-CM

## 2019-08-05 DIAGNOSIS — Z125 Encounter for screening for malignant neoplasm of prostate: Secondary | ICD-10-CM

## 2019-08-05 DIAGNOSIS — Z23 Encounter for immunization: Secondary | ICD-10-CM

## 2019-08-05 DIAGNOSIS — R200 Anesthesia of skin: Secondary | ICD-10-CM

## 2019-08-05 DIAGNOSIS — E039 Hypothyroidism, unspecified: Secondary | ICD-10-CM | POA: Diagnosis not present

## 2019-08-05 DIAGNOSIS — L603 Nail dystrophy: Secondary | ICD-10-CM | POA: Diagnosis not present

## 2019-08-05 DIAGNOSIS — Z8639 Personal history of other endocrine, nutritional and metabolic disease: Secondary | ICD-10-CM

## 2019-08-05 LAB — CBC WITH DIFFERENTIAL/PLATELET
Basophils Absolute: 0 10*3/uL (ref 0.0–0.1)
Basophils Relative: 0.5 % (ref 0.0–3.0)
Eosinophils Absolute: 0 10*3/uL (ref 0.0–0.7)
Eosinophils Relative: 0.6 % (ref 0.0–5.0)
HCT: 41.7 % (ref 39.0–52.0)
Hemoglobin: 13.5 g/dL (ref 13.0–17.0)
Lymphocytes Relative: 32.1 % (ref 12.0–46.0)
Lymphs Abs: 1.5 10*3/uL (ref 0.7–4.0)
MCHC: 32.5 g/dL (ref 30.0–36.0)
MCV: 86.9 fl (ref 78.0–100.0)
Monocytes Absolute: 0.5 10*3/uL (ref 0.1–1.0)
Monocytes Relative: 9.9 % (ref 3.0–12.0)
Neutro Abs: 2.7 10*3/uL (ref 1.4–7.7)
Neutrophils Relative %: 56.9 % (ref 43.0–77.0)
Platelets: 299 10*3/uL (ref 150.0–400.0)
RBC: 4.8 Mil/uL (ref 4.22–5.81)
RDW: 13.4 % (ref 11.5–15.5)
WBC: 4.8 10*3/uL (ref 4.0–10.5)

## 2019-08-05 LAB — LIPID PANEL
Cholesterol: 176 mg/dL (ref 0–200)
HDL: 42.3 mg/dL (ref >39.00)
LDL Cholesterol: 115 mg/dL — ABNORMAL HIGH (ref 0–99)
NonHDL: 133.61
Total CHOL/HDL Ratio: 4
Triglycerides: 93 mg/dL (ref 0.0–149.0)
VLDL: 18.6 mg/dL (ref 0.0–40.0)

## 2019-08-05 LAB — TSH: TSH: 1.45 u[IU]/mL (ref 0.35–4.50)

## 2019-08-05 LAB — COMPREHENSIVE METABOLIC PANEL
ALT: 25 U/L (ref 0–53)
AST: 24 U/L (ref 0–37)
Albumin: 4.1 g/dL (ref 3.5–5.2)
Alkaline Phosphatase: 63 U/L (ref 39–117)
BUN: 14 mg/dL (ref 6–23)
CO2: 29 mEq/L (ref 19–32)
Calcium: 9.3 mg/dL (ref 8.4–10.5)
Chloride: 105 mEq/L (ref 96–112)
Creatinine, Ser: 1.02 mg/dL (ref 0.40–1.50)
GFR: 74.89 mL/min (ref >60.00)
Glucose, Bld: 96 mg/dL (ref 70–99)
Potassium: 4.8 mEq/L (ref 3.5–5.1)
Sodium: 140 mEq/L (ref 135–145)
Total Bilirubin: 0.6 mg/dL (ref 0.2–1.2)
Total Protein: 6.6 g/dL (ref 6.0–8.3)

## 2019-08-05 LAB — PSA: PSA: 0.48 ng/mL (ref 0.10–4.00)

## 2019-08-05 LAB — HEMOGLOBIN A1C: Hgb A1c MFr Bld: 6.1 % (ref 4.6–6.5)

## 2019-08-05 MED ORDER — LEVOTHYROXINE SODIUM 150 MCG PO TABS: 150.0000 ug | ORAL_TABLET | Freq: Every day | ORAL | 3 refills | Status: DC

## 2019-08-05 NOTE — Patient Instructions (Signed)
GO TO THE LAB : Get the blood work     GO TO THE FRONT DESK Schedule a nurse visit in 2 to 3 months for your next Shingrix Schedule your next appointment   for a physical in 1 year  Referrals: Dermatology General surgery  Take naproxen 220 mg OTC: 2 tablets twice a day with food for 10 days. Always take it with food because may cause gastritis and ulcers.  If you notice nausea, stomach pain, change in the color of stools --->  Stop the medicine and let us know If the arm numbness is not better or if you get worse call for a referral   Inguinal Hernia, Adult An inguinal hernia is when fat or your intestines push through a weak spot in a muscle where your leg meets your lower belly (groin). This causes a rounded lump (bulge). This kind of hernia could also be:  In your scrotum, if you are male.  In folds of skin around your vagina, if you are male. There are three types of inguinal hernias. These include:  Hernias that can be pushed back into the belly (are reducible). This type rarely causes pain.  Hernias that cannot be pushed back into the belly (are incarcerated).  Hernias that cannot be pushed back into the belly and lose their blood supply (are strangulated). This type needs emergency surgery. If you do not have symptoms, you may not need treatment. If you have symptoms or a large hernia, you may need surgery. Follow these instructions at home: Lifestyle  Do these things if told by your doctor so you do not have trouble pooping (constipation): ? Drink enough fluid to keep your pee (urine) pale yellow. ? Eat foods that have a lot of fiber. These include fresh fruits and vegetables, whole grains, and beans. ? Limit foods that are high in fat and processed sugars. These include foods that are fried or sweet. ? Take medicine for trouble pooping.  Avoid lifting heavy objects.  Avoid standing for long amounts of time.  Do not use any products that contain nicotine or  tobacco. These include cigarettes and e-cigarettes. If you need help quitting, ask your doctor.  Stay at a healthy weight. General instructions  You may try to push your hernia in by very gently pressing on it when you are lying down. Do not try to force the bulge back in if it will not push in easily.  Watch your hernia for any changes in shape, size, or color. Tell your doctor if you see any changes.  Take over-the-counter and prescription medicines only as told by your doctor.  Keep all follow-up visits as told by your doctor. This is important. Contact a doctor if:  You have a fever.  You have new symptoms.  Your symptoms get worse. Get help right away if:  The area where your leg meets your lower belly has: ? Pain that gets worse suddenly. ? A bulge that gets bigger suddenly, and it does not get smaller after that. ? A bulge that turns red or purple. ? A bulge that is painful when you touch it.  You are a man, and your scrotum: ? Suddenly feels painful. ? Suddenly changes in size.  You cannot push the hernia in by very gently pressing on it when you are lying down. Do not try to force the bulge back in if it will not push in easily.  You feel sick to your stomach (nauseous), and that feeling  does not go away.  You throw up (vomit), and that keeps happening.  You have a fast heartbeat.  You cannot poop (have a bowel movement) or pass gas. These symptoms may be an emergency. Do not wait to see if the symptoms will go away. Get medical help right away. Call your local emergency services (911 in the [place] Summary  An inguinal hernia is when fat or your intestines push through a weak spot in a muscle where your leg meets your lower belly (groin). This causes a rounded lump (bulge).  If you do not have symptoms, you may not need treatment. If you have symptoms or a large hernia, you may need surgery.  Avoid lifting heavy objects. Also avoid standing for long amounts of  time.  Do not try to force the bulge back in if it will not push in easily. This information is not intended to replace advice given to you by your health care provider. Make sure you discuss any questions you have with your health care provider. Document Released: 09/19/2006 Document Revised: 09/20/2017 Document Reviewed: 05/21/2017 Elsevier Patient Education  2020 [employer].

## 2019-08-05 NOTE — Progress Notes (Signed)
Subjective:    Patient ID: Brad Stewart, male    DOB: Jun 05, 1961, 58 y.o.   MRN: [id number]  DOS:  08/05/2019 Type of visit - description: CPX A number of other issues discussed today: Hypothyroidism: Due for labs Worked on the yard 4 days ago, found tick at the left leg, no rash. Also after he work in the yard developed left arm numbness, unable to say if numbness located at the internal or external aspect of the arm but it goes up to the fingers. During the yard work he did not experience any exertional chest pain, no difficulty breathing.  Denies neck pain or any injury. Thumb nails: They are both dystrophic. Left hernia: He knew for a while that he has a hernia, x the last 2 weeks has been more noticeable, he is able to push it in. No scrotal pain, rash or lump per se   Review of Systems  Other than above, a 14 point review of systems is negative    Past Medical History:  Diagnosis Date  . Hypothyroidism     Past Surgical History:  Procedure Laterality Date  . SHOULDER ARTHROSCOPY Left     Social History   Socioeconomic History  . Marital status: Married    Spouse name: Not on file  . Number of children: 2  . Years of education: Not on file  . Highest education level: Not on file  Occupational History  . Occupation: Designer, multimedia  . Financial resource strain: Not on file  . Food insecurity    Worry: Not on file    Inability: Not on file  . Transportation needs    Medical: Not on file    Non-medical: Not on file  Tobacco Use  . Smoking status: Never Smoker  . Smokeless tobacco: Never Used  Substance and Sexual Activity  . Alcohol use: Yes    Alcohol/week: 0.0 standard drinks    Comment: socially   . Drug use: No  . Sexual activity: Not on file  Lifestyle  . Physical activity    Days per week: Not on file    Minutes per session: Not on file  . Stress: Not on file  Relationships  . Social Herbalist on phone: Not on file   Gets together: Not on file    Attends religious service: Not on file    Active member of club or organization: Not on file    Attends meetings of clubs or organizations: Not on file    Relationship status: Not on file  . Intimate partner violence    Fear of current or ex partner: Not on file    Emotionally abused: Not on file    Physically abused: Not on file    Forced sexual activity: Not on file  Other Topics Concern  . Not on file  Social History Narrative   Household-- pt and wife , 2 adult children     Family History  Problem Relation Age of Onset  . Coronary artery disease Father 37       dx age 37, had CABG  . Bladder Cancer Father   . Diabetes Other        grandmother, F (late onset)  . Colon cancer Neg Hx   . Prostate cancer Neg Hx   . Stroke Neg Hx      Allergies as of 08/05/2019      Reactions   Sulfonamide Derivatives  REACTION: n/v      Medication List       Accurate as of August 05, 2019 11:59 PM. If you have any questions, ask your nurse or doctor.        cholecalciferol 25 MCG (1000 UT) tablet Commonly known as: VITAMIN D3 Take 1,000 Units by mouth daily.   levothyroxine 150 MCG tablet Commonly known as: SYNTHROID Take 1 tablet (150 mcg total) by mouth daily before breakfast. What changed: additional instructions Changed by: Kathlene November, MD           Objective:   Physical Exam BP (!) 137/92 (BP Location: Left Arm, Patient Position: Sitting, Cuff Size: Normal)   Pulse 63   Temp (!) 96.8 F (36 C) (Temporal)   Resp 16   Ht 6\' 1"  (1.854 m)   Wt 229 lb (103.9 kg)   SpO2 99%   BMI 30.21 kg/m  General: Well developed, NAD, BMI noted Neck: No  thyromegaly  HEENT:  Normocephalic . Face symmetric, atraumatic Neck: No TTP of the cervical spine range of motion normal Lungs:  CTA B Normal respiratory effort, no intercostal retractions, no accessory muscle use. Heart: RRR,  no murmur.  No pretibial edema bilaterally  Abdomen:  Not  distended, soft, non-tender. No rebound or rigidity.   Groins: + Reducible hernia on the left suprapubic area GU: DRE-normal sphincter tone, prostate normal size.  Not tender Skin: Both thumbnails have a line of dystrophy that goes from the base of the nail distally. Neurologic:  alert & oriented X3.  Speech normal, gait appropriate for age and unassisted Strength symmetric and appropriate for age.  Motor and DTRs: Symmetric Psych: Cognition and judgment appear intact.  Cooperative with normal attention span and concentration.  Behavior appropriate. No anxious or depressed appearing.     Assessment      Assessment   Prediabetes- 10-2014 ---> A1c  6.1 Hypothyroidism Vitamin D deficiency Cold sores   PLAN: Here for CPX Prediabetes: Check A1c Hypothyroidism: On Synthroid, check a TSH Vitamin D deficiency: Last levels very good, on supplements.  Reassess next year Dystrophic nails: Unclear etiology, refer to dermatology. Left inguinal hernia: Incarceration symptoms discussed, refer to surgery Left arm numbness: Denies neck pain, discomfort is not consistent with CAD, suspect possibly a radiculopathy, atypical.  Naproxen twice a day with food for 10 days, soon referral to Ortho if not improving. EKG today: NSR RTC 1 year CPX  Today in addition to the CPX, I spent more than 25   min with the patient: >50% of the time counseling regards his chronic medical problems and also 3 new issues: Dystrophic nails, I inguinal hernia and left arm numbness   This visit occurred during the SARS-CoV-2 public health emergency.  Safety protocols were in place, including screening questions prior to the visit, additional usage of staff PPE, and extensive cleaning of exam room while observing appropriate contact time as indicated for disinfecting solutions.

## 2019-08-05 NOTE — Progress Notes (Signed)
Pre visit review using our clinic review tool, if applicable. No additional management support is needed unless otherwise documented below in the visit note. 

## 2019-08-07 NOTE — Assessment & Plan Note (Signed)
Tdap 2018 Declined a flu shot Shingrix d/w pt: #1 provided today, next in 3 months  Prostate cancer screening:  DRE normal, check a PSA CCS: previous 2 referrals failed, 3 options d/w pt, elected to be referred again CMP, FLP, CBC, A1c, TSH, PSA

## 2019-08-07 NOTE — Assessment & Plan Note (Signed)
Here for CPX Prediabetes: Check A1c Hypothyroidism: On Synthroid, check a TSH Vitamin D deficiency: Last levels very good, on supplements.  Reassess next year Dystrophic nails: Unclear etiology, refer to dermatology. Left inguinal hernia: Incarceration symptoms discussed, refer to surgery Left arm numbness: Denies neck pain, discomfort is not consistent with CAD, suspect possibly a radiculopathy, atypical.  Naproxen twice a day with food for 10 days, soon referral to Ortho if not improving. EKG today: NSR RTC 1 year CPX

## 2019-08-10 ENCOUNTER — Encounter: Payer: Self-pay | Admitting: Gastroenterology

## 2019-09-03 DIAGNOSIS — U071 COVID-19: Secondary | ICD-10-CM

## 2019-09-03 HISTORY — DX: COVID-19: U07.1

## 2019-09-05 ENCOUNTER — Telehealth: Payer: BC Managed Care – PPO | Admitting: Physician Assistant

## 2019-09-05 DIAGNOSIS — Z20822 Contact with and (suspected) exposure to covid-19: Secondary | ICD-10-CM

## 2019-09-05 DIAGNOSIS — J029 Acute pharyngitis, unspecified: Secondary | ICD-10-CM

## 2019-09-05 NOTE — Progress Notes (Signed)
E-Visit for [employer] Virus Screening   Your current symptoms could be consistent with the coronavirus which is causing inflammation and pain in the throat. Strep throat is typically accompanied by fever, aches and lack of nasal congestion, cough or other upper respiratory symptoms. I recommend you follow supportive measures below -- including tylenol and salt-water gargles for sore throat. A good saline nasal rinse for congestion and Delsym OTC for cough itself. If you have a humidifier, put it in the bedroom and run at night. Have testing tomorrow. They will also be able to do a throat swab for strep at the same time if they see concerns for this. It is unlikely that you would have both at the same time, but these other symptoms are not common with a bacterial pharyngitis (strep) and most consistent with a viral infection.   We are enrolling you in our [place] for [place] . Daily you will receive a questionnaire within the [place] website. Our COVID 19 response team will be monitoring your responses daily.  Testing Information: The COVID-19 Community Testing sites will begin testing BY APPOINTMENT ONLY.  You can schedule online at [email or web address]  If you do not have access to a smart phone or computer you may call [phone] for an appointment.  Testing Locations: Appointment schedule is 8 am to 3:30 pm at all sites  [facility] indoors at [address] [place] [address] [facility]  indoors at [place]. [address] [place], [place] [address] [place] indoors at [address], [place] [address]  Additional testing sites in the Community:  . For [facility] Testing sites in [facility]  FaceUpdate.uy  . For Pop-up testing sites in [place]  BowlDirectory.co.uk  . For Testing sites with regular hours [email or web address].[email or web address]  . For  [place] [email or web address]  . For [facility] [email or web address]  . For [facility] testing in [place] and [employer] [email or web address]  . For [facility] testing in [facility]   [email or web address].[email or web address]  For  more information about community testing call 2026106303   We are enrolling you in our [place] for [place] . Daily you will receive a questionnaire within the [place] website. Our COVID 19 response team will be monitoring your responses daily.  Please quarantine yourself while awaiting your test results. If you develop fever/cough/breathlessness, please stay home for 10 days with improving symptoms and until you have had 24 hours of no fever (without taking a fever reducer).  You should wear a mask or cloth face covering over your nose and mouth if you must be around other people or animals, including pets (even at home). Try to stay at least 6 feet away from other people. This will protect the people around you.  Please continue good preventive care measures, including:  frequent hand-washing, avoid touching your face, cover coughs/sneezes, stay out of crowds and keep a 6 foot distance from others.  COVID-19 is a respiratory illness with symptoms that are similar to the flu. Symptoms are typically mild to moderate, but there have been cases of severe illness and death due to the virus.   The following symptoms may appear 2-14 days after exposure: . Fever . Cough . Shortness of breath or difficulty breathing . Chills . Repeated shaking with chills . Muscle pain . Headache . Sore throat . New loss of taste or smell . Fatigue . Congestion or runny nose . Nausea or  vomiting . Diarrhea  Go  to the nearest hospital ED for assessment if fever/cough/breathlessness are severe or illness seems like a threat to life.  It is vitally important that if you feel that you have an infection such as this virus or any other virus that you stay home and away from places where you may spread it to others.  You should avoid contact with people age 66 and older.  You may also take acetaminophen (Tylenol) as needed for fever.  Reduce your risk of any infection by using the same precautions used for avoiding the common cold or flu:  Marland Kitchen Wash your hands often with soap and warm water for at least 20 seconds.  If soap and water are not readily available, use an alcohol-based hand sanitizer with at least 60% alcohol.  . If coughing or sneezing, cover your mouth and nose by coughing or sneezing into the elbow areas of your shirt or coat, into a tissue or into your sleeve (not your hands). . Avoid shaking hands with others and consider head nods or verbal greetings only. . Avoid touching your eyes, nose, or mouth with unwashed hands.  . Avoid close contact with people who are sick. . Avoid places or events with large numbers of people in one location, like concerts or sporting events. . Carefully consider travel plans you have or are making. . If you are planning any travel outside or inside the [place], visit the CDC's Travelers' Health webpage for the latest health notices. . If you have some symptoms but not all symptoms, continue to monitor at home and seek medical attention if your symptoms worsen. . If you are having a medical emergency, call 911.  HOME CARE . Only take medications as instructed by your medical team. . Drink plenty of fluids and get plenty of rest. . A steam or ultrasonic humidifier can help if you have congestion.   GET HELP RIGHT AWAY IF YOU HAVE EMERGENCY WARNING SIGNS** FOR COVID-19. If you or someone is showing any of these signs seek emergency medical care  immediately. Call 911 or proceed to your closest emergency facility if: . You develop worsening high fever. . Trouble breathing . Bluish lips or face . Persistent pain or pressure in the chest . New confusion . Inability to wake or stay awake . You cough up blood. . Your symptoms become more severe  **This list is not all possible symptoms. Contact your medical provider for any symptoms that are sever or concerning to you.  MAKE SURE YOU   Understand these instructions.  Will watch your condition.  Will get help right away if you are not doing well or get worse.  Your e-visit answers were reviewed by a board certified advanced clinical practitioner to complete your personal care plan.  Depending on the condition, your plan could have included both over the counter or prescription medications.  If there is a problem please reply once you have received a response from your provider.  Your safety is important to [place].  If you have drug allergies check your prescription carefully.    You can use MyChart to ask questions about today's visit, request a non-urgent call back, or ask for a work or school excuse for 24 hours related to this e-Visit. If it has been greater than 24 hours you will need to follow up with your provider, or enter a new e-Visit to address those concerns. You will get an e-mail in the next two days asking about your experience.  I  hope that your e-visit has been valuable and will speed your recovery. Thank you for using e-visits.

## 2019-09-05 NOTE — Progress Notes (Signed)
I have spent 5 minutes in review of e-visit questionnaire, review and updating patient chart, medical decision making and response to patient.   Cadynce Garrette Cody Jossiah Smoak, PA-C    

## 2019-09-06 DIAGNOSIS — Z20828 Contact with and (suspected) exposure to other viral communicable diseases: Secondary | ICD-10-CM | POA: Diagnosis not present

## 2019-09-15 ENCOUNTER — Ambulatory Visit (AMBULATORY_SURGERY_CENTER): Payer: Self-pay | Admitting: *Deleted

## 2019-09-15 VITALS — Temp 100.2°F | Ht 73.0 in

## 2019-09-15 DIAGNOSIS — Z1211 Encounter for screening for malignant neoplasm of colon: Secondary | ICD-10-CM

## 2019-09-15 NOTE — Progress Notes (Signed)
Patient's temp=100.2, and he states he + covid on 09/06/2019. Pt denies any symptoms. Reschedule nurse visit for 2/5 and Colon 2/19 due to recent + Covid and low grade temp.. Pt is aware.

## 2019-09-24 ENCOUNTER — Encounter: Payer: BC Managed Care – PPO | Admitting: Gastroenterology

## 2019-10-06 ENCOUNTER — Other Ambulatory Visit: Payer: Self-pay

## 2019-10-06 ENCOUNTER — Ambulatory Visit (INDEPENDENT_AMBULATORY_CARE_PROVIDER_SITE_OTHER): Payer: BC Managed Care – PPO

## 2019-10-06 DIAGNOSIS — Z23 Encounter for immunization: Secondary | ICD-10-CM

## 2019-10-08 ENCOUNTER — Ambulatory Visit: Payer: BC Managed Care – PPO | Admitting: *Deleted

## 2019-10-08 ENCOUNTER — Other Ambulatory Visit: Payer: Self-pay

## 2019-10-08 VITALS — Temp 96.0°F | Ht 73.0 in | Wt 235.0 lb

## 2019-10-08 DIAGNOSIS — Z1211 Encounter for screening for malignant neoplasm of colon: Secondary | ICD-10-CM

## 2019-10-08 MED ORDER — NA SULFATE-K SULFATE-MG SULF 17.5-3.13-1.6 GM/177ML PO SOLN: ORAL | 0 refills | Status: DC

## 2019-10-08 NOTE — Progress Notes (Signed)
Pt is aware that care partner will wait in the car during procedure; if they feel like they will be too hot or cold to wait in the car; they may wait in the 4 th floor lobby. Patient is aware to bring only one care partner. We want them to wear a mask (we do not have any that we can provide them), practice social distancing, and we will check their temperatures when they get here.  I did remind the patient that their care partner needs to stay in the parking lot the entire time and have a cell phone available, we will call them when the pt is ready for discharge. Patient will wear mask into building.  Pt had covid 19 on 09-06-19- no covid test needed  No egg or soy allergy  No home oxygen use or problems with anesthesia  No medications for weight loss taken  emmi information given  No trouble moving neck  $15 off Suprep coupon given

## 2019-10-21 ENCOUNTER — Encounter: Payer: Self-pay | Admitting: Gastroenterology

## 2019-10-22 ENCOUNTER — Other Ambulatory Visit: Payer: Self-pay

## 2019-10-22 ENCOUNTER — Encounter: Payer: Self-pay | Admitting: Gastroenterology

## 2019-10-22 ENCOUNTER — Ambulatory Visit (AMBULATORY_SURGERY_CENTER): Payer: BC Managed Care – PPO | Admitting: Gastroenterology

## 2019-10-22 VITALS — BP 112/75 | HR 69 | Temp 96.6°F | Resp 22 | Ht 73.0 in | Wt 235.0 lb

## 2019-10-22 DIAGNOSIS — D123 Benign neoplasm of transverse colon: Secondary | ICD-10-CM

## 2019-10-22 DIAGNOSIS — Z1211 Encounter for screening for malignant neoplasm of colon: Secondary | ICD-10-CM

## 2019-10-22 MED ORDER — SODIUM CHLORIDE 0.9 % IV SOLN: 500.0000 mL | Freq: Once | INTRAVENOUS | Status: DC

## 2019-10-22 NOTE — Progress Notes (Signed)
Temp by YF Vitals by DT

## 2019-10-22 NOTE — Op Note (Signed)
[facility] Patient Name: Brad Stewart Procedure Date: 10/22/2019 1:31 PM MRN: [id number] Endoscopist: Thornton Park MD, MD Age: 58 Referring MD:  Date of Birth: 31-Jul-1961 Gender: Male Account #: [id number] Procedure:                Colonoscopy Indications:              Screening for colorectal malignant neoplasm, This                            is the patient's first colonoscopy                           No known family history of colon cancer or polyps Medicines:                Monitored Anesthesia Care Procedure:                Pre-Anesthesia Assessment:                           - Prior to the procedure, a History and Physical                            was performed, and patient medications and                            allergies were reviewed. The patient's tolerance of                            previous anesthesia was also reviewed. The risks                            and benefits of the procedure and the sedation                            options and risks were discussed with the patient.                            All questions were answered, and informed consent                            was obtained. Prior Anticoagulants: The patient has                            taken no previous anticoagulant or antiplatelet                            agents. ASA Grade Assessment: II - A patient with                            mild systemic disease. After reviewing the risks                            and benefits, the patient was deemed in  satisfactory condition to undergo the procedure.                           After obtaining informed consent, the colonoscope                            was passed under direct vision. Throughout the                            procedure, the patient's blood pressure, pulse, and                            oxygen saturations were monitored continuously. The                            Colonoscope was  introduced through the anus and                            advanced to the 3 cm into the ileum. A second                            forward view of the right colon was performed. The                            colonoscopy was performed without difficulty. The                            patient tolerated the procedure well. The quality                            of the bowel preparation was good. The terminal                            ileum, ileocecal valve, appendiceal orifice, and                            rectum were photographed. Scope In: 1:39:37 PM Scope Out: 1:57:20 PM Scope Withdrawal Time: 0 hours 15 minutes 32 seconds  Total Procedure Duration: 0 hours 17 minutes 43 seconds  Findings:                 The perianal and digital rectal examinations were                            normal.                           Non-bleeding internal hemorrhoids were found during                            retroflexion. The hemorrhoids were small.                           A 1 mm polyp was found in the splenic flexure. The  polyp was sessile. The polyp was removed with a                            cold snare. Resection and retrieval were complete.                            Estimated blood loss was minimal.                           Multiple small-mouthed diverticula were found in                            the entire colon.                           The exam was otherwise without abnormality on                            direct and retroflexion views. Complications:            No immediate complications. Estimated blood loss:                            Minimal. Estimated Blood Loss:     Estimated blood loss was minimal. Impression:               - Non-bleeding internal hemorrhoids.                           - One 1 mm polyp at the splenic flexure, removed                            with a cold snare. Resected and retrieved.                           - Diverticulosis in  the entire examined colon.                           - The examination was otherwise normal on direct                            and retroflexion views. Recommendation:           - Patient has a contact number available for                            emergencies. The signs and symptoms of potential                            delayed complications were discussed with the                            patient. Return to normal activities tomorrow.                            Written discharge instructions were provided to the  patient.                           - High fiber diet.                           - Continue present medications.                           - Await pathology results.                           - Repeat colonoscopy date to be determined after                            pending pathology results are reviewed for                            surveillance. Thornton Park MD, MD 10/22/2019 2:04:16 PM This report has been signed electronically.

## 2019-10-22 NOTE — Progress Notes (Signed)
PT taken to PACU. Monitors in place. VSS. Report given to RN. 

## 2019-10-22 NOTE — Progress Notes (Signed)
Called to room to assist during endoscopic procedure.  Patient ID and intended procedure confirmed with present staff. Received instructions for my participation in the procedure from the performing physician.  

## 2019-10-22 NOTE — Patient Instructions (Addendum)
THank you for letting us take care of your healthcare needs today. Please see handout given to you on Polyps and Diverticulosis and Hemorrhoids.    YOU HAD AN ENDOSCOPIC PROCEDURE TODAY AT [facility]:   Refer to the procedure report that was given to you for any specific questions about what was found during the examination.  If the procedure report does not answer your questions, please call your gastroenterologist to clarify.  If you requested that your care partner not be given the details of your procedure findings, then the procedure report has been included in a sealed envelope for you to review at your convenience later.  YOU SHOULD EXPECT: Some feelings of bloating in the abdomen. Passage of more gas than usual.  Walking can help get rid of the air that was put into your GI tract during the procedure and reduce the bloating. If you had a lower endoscopy (such as a colonoscopy or flexible sigmoidoscopy) you may notice spotting of blood in your stool or on the toilet paper. If you underwent a bowel prep for your procedure, you may not have a normal bowel movement for a few days.  Please Note:  You might notice some irritation and congestion in your nose or some drainage.  This is from the oxygen used during your procedure.  There is no need for concern and it should clear up in a day or so.  SYMPTOMS TO REPORT IMMEDIATELY:   Following lower endoscopy (colonoscopy or flexible sigmoidoscopy):  Excessive amounts of blood in the stool  Significant tenderness or worsening of abdominal pains  Swelling of the abdomen that is new, acute  Fever of 100F or higher   For urgent or emergent issues, a gastroenterologist can be reached at any hour by calling [phone].   DIET:  We do recommend a small meal at first, but then you may proceed to your regular diet.  Drink plenty of fluids but you should avoid alcoholic beverages for 24 hours.  ACTIVITY:  You should plan to  take it easy for the rest of today and you should NOT DRIVE or use heavy machinery until tomorrow (because of the sedation medicines used during the test).    FOLLOW UP: Our staff will call the number listed on your records 48-72 hours following your procedure to check on you and address any questions or concerns that you may have regarding the information given to you following your procedure. If we do not reach you, we will leave a message.  We will attempt to reach you two times.  During this call, we will ask if you have developed any symptoms of COVID 19. If you develop any symptoms (ie: fever, flu-like symptoms, shortness of breath, cough etc.) before then, please call [phone].  If you test positive for Covid 19 in the 2 weeks post procedure, please call and report this information to [place].    If any biopsies were taken you will be contacted by phone or by letter within the next 1-3 weeks.  Please call us at [phone] if you have not heard about the biopsies in 3 weeks.    SIGNATURES/CONFIDENTIALITY: You and/or your care partner have signed paperwork which will be entered into your electronic medical record.  These signatures attest to the fact that that the information above on your After Visit Summary has been reviewed and is understood.  Full responsibility of the confidentiality of this discharge information lies with you and/or your care-partner.

## 2019-10-25 ENCOUNTER — Telehealth: Payer: Self-pay | Admitting: *Deleted

## 2019-10-25 NOTE — Telephone Encounter (Signed)
  Follow up Call-  Call back number 10/22/2019  Post procedure Call Back phone  # [phone]  Permission to leave phone message Yes  Some recent data might be hidden     Patient questions:  Do you have a fever, pain , or abdominal swelling? No. Pain Score  0 *  Have you tolerated food without any problems? Yes.    Have you been able to return to your normal activities? Yes.    Do you have any questions about your discharge instructions: Diet   No. Medications  No. Follow up visit  No.  Do you have questions or concerns about your Care? No.  Actions: * If pain score is 4 or above: No action needed, pain <4.  1. Have you developed a fever since your procedure? NO  2.   Have you had an respiratory symptoms (SOB or cough) since your procedure? NO  3.   Have you tested positive for COVID 19 since your procedure NO  4.   Have you had any family members/close contacts diagnosed with the COVID 19 since your procedure?  NO   If yes to any of these questions please route to Joylene John, RN and Alphonsa Gin, RN.

## 2019-10-25 NOTE — Telephone Encounter (Signed)
First attempt, left VM.  

## 2019-10-27 ENCOUNTER — Encounter: Payer: Self-pay | Admitting: Gastroenterology

## 2019-12-15 DIAGNOSIS — L309 Dermatitis, unspecified: Secondary | ICD-10-CM | POA: Diagnosis not present

## 2019-12-15 DIAGNOSIS — L603 Nail dystrophy: Secondary | ICD-10-CM | POA: Diagnosis not present

## 2019-12-15 DIAGNOSIS — D225 Melanocytic nevi of trunk: Secondary | ICD-10-CM | POA: Diagnosis not present

## 2019-12-15 DIAGNOSIS — D485 Neoplasm of uncertain behavior of skin: Secondary | ICD-10-CM | POA: Diagnosis not present

## 2020-02-24 DIAGNOSIS — K429 Umbilical hernia without obstruction or gangrene: Secondary | ICD-10-CM | POA: Diagnosis not present

## 2020-02-24 DIAGNOSIS — K4090 Unilateral inguinal hernia, without obstruction or gangrene, not specified as recurrent: Secondary | ICD-10-CM | POA: Diagnosis not present

## 2020-03-07 DIAGNOSIS — K4090 Unilateral inguinal hernia, without obstruction or gangrene, not specified as recurrent: Secondary | ICD-10-CM | POA: Insufficient documentation

## 2020-03-07 DIAGNOSIS — K429 Umbilical hernia without obstruction or gangrene: Secondary | ICD-10-CM | POA: Insufficient documentation

## 2020-03-22 ENCOUNTER — Encounter: Payer: Self-pay | Admitting: Family Medicine

## 2020-03-22 ENCOUNTER — Ambulatory Visit (INDEPENDENT_AMBULATORY_CARE_PROVIDER_SITE_OTHER): Payer: BC Managed Care – PPO | Admitting: Family Medicine

## 2020-03-22 ENCOUNTER — Other Ambulatory Visit: Payer: Self-pay

## 2020-03-22 ENCOUNTER — Ambulatory Visit (HOSPITAL_BASED_OUTPATIENT_CLINIC_OR_DEPARTMENT_OTHER)
Admission: RE | Admit: 2020-03-22 | Discharge: 2020-03-22 | Disposition: A | Discharge status: Home / Self Care | Payer: BC Managed Care – PPO | Source: Ambulatory Visit | Attending: Family Medicine | Admitting: Family Medicine

## 2020-03-22 ENCOUNTER — Telehealth: Payer: Self-pay

## 2020-03-22 VITALS — BP 130/90 | HR 61 | Temp 97.6°F | Resp 17 | Ht 73.0 in | Wt 228.0 lb

## 2020-03-22 DIAGNOSIS — N201 Calculus of ureter: Secondary | ICD-10-CM | POA: Diagnosis not present

## 2020-03-22 DIAGNOSIS — N200 Calculus of kidney: Secondary | ICD-10-CM | POA: Diagnosis not present

## 2020-03-22 DIAGNOSIS — R109 Unspecified abdominal pain: Secondary | ICD-10-CM | POA: Diagnosis not present

## 2020-03-22 DIAGNOSIS — M549 Dorsalgia, unspecified: Secondary | ICD-10-CM

## 2020-03-22 DIAGNOSIS — I700 Atherosclerosis of aorta: Secondary | ICD-10-CM | POA: Diagnosis not present

## 2020-03-22 DIAGNOSIS — K4090 Unilateral inguinal hernia, without obstruction or gangrene, not specified as recurrent: Secondary | ICD-10-CM | POA: Diagnosis not present

## 2020-03-22 LAB — POCT URINALYSIS DIP (MANUAL ENTRY)
Bilirubin, UA: NEGATIVE
Glucose, UA: NEGATIVE mg/dL
Ketones, POC UA: NEGATIVE mg/dL
Leukocytes, UA: NEGATIVE
Nitrite, UA: NEGATIVE
Spec Grav, UA: >=1.03 — AB (ref 1.010–1.025)
Urobilinogen, UA: 0.2 E.U./dL
pH, UA: 5.5 (ref 5.0–8.0)

## 2020-03-22 LAB — COMPREHENSIVE METABOLIC PANEL
ALT: 23 U/L (ref 0–53)
AST: 23 U/L (ref 0–37)
Albumin: 4.1 g/dL (ref 3.5–5.2)
Alkaline Phosphatase: 64 U/L (ref 39–117)
BUN: 15 mg/dL (ref 6–23)
CO2: 28 mEq/L (ref 19–32)
Calcium: 9.1 mg/dL (ref 8.4–10.5)
Chloride: 107 mEq/L (ref 96–112)
Creatinine, Ser: 1.19 mg/dL (ref 0.40–1.50)
GFR: 62.55 mL/min (ref >60.00)
Glucose, Bld: 104 mg/dL — ABNORMAL HIGH (ref 70–99)
Potassium: 4.9 mEq/L (ref 3.5–5.1)
Sodium: 140 mEq/L (ref 135–145)
Total Bilirubin: 0.7 mg/dL (ref 0.2–1.2)
Total Protein: 6.8 g/dL (ref 6.0–8.3)

## 2020-03-22 LAB — URINALYSIS, MICROSCOPIC ONLY

## 2020-03-22 LAB — CBC
HCT: 41.1 % (ref 39.0–52.0)
Hemoglobin: 13.5 g/dL (ref 13.0–17.0)
MCHC: 32.8 g/dL (ref 30.0–36.0)
MCV: 87.8 fl (ref 78.0–100.0)
Platelets: 300 10*3/uL (ref 150.0–400.0)
RBC: 4.68 Mil/uL (ref 4.22–5.81)
RDW: 13.4 % (ref 11.5–15.5)
WBC: 7.6 10*3/uL (ref 4.0–10.5)

## 2020-03-22 MED ORDER — MELOXICAM 7.5 MG PO TABS: 7.5000 mg | ORAL_TABLET | Freq: Every day | ORAL | 0 refills | Status: DC

## 2020-03-22 MED ORDER — HYDROCODONE-ACETAMINOPHEN 5-325 MG PO TABS: 1.0000 | ORAL_TABLET | Freq: Three times a day (TID) | ORAL | 0 refills | Status: AC | PRN

## 2020-03-22 MED ORDER — TAMSULOSIN HCL 0.4 MG PO CAPS: 0.4000 mg | ORAL_CAPSULE | Freq: Every day | ORAL | 1 refills | Status: DC

## 2020-03-22 NOTE — Patient Instructions (Signed)
It was nice to meet you today, but I am sorry you are having this back pain.  We suspect you have a kidney stone!  Please stop by the lab to have some blood work, then you can go pick up your prescriptions.  We will plan to do your CT scan at 3:30 this afternoon [facility] at the [facility]-I will be in touch with this report later this evening  Use the pain medications as needed, rest, drink plenty of fluids

## 2020-03-22 NOTE — Progress Notes (Addendum)
[facility] [address] [place], [place] [address] [phone]  Date:  03/22/2020   Name:  Brad Stewart   DOB:  1960-12-26   MRN:  [id number]  PCP:  Colon Branch, MD    Chief Complaint: Back Pain (lower left side back pain, severe pain, dizziness)   History of Present Illness:  Brad Stewart is a 59 y.o. very pleasant male patient who presents with the following:  Pt here today with concern of kidney or back pain Pt of my partner Dr Larose Kells whom I have not seen in the past He is generally in good health   He noted onset of left lower back pain last night around 10 pm- came out of the blue and got quite severe.  No new activities or lifting, no inciting injury that he can think of.  He noted the pain was quite sudden and severe, "it brought me to my knees," then eased off.  He spent an uncomfortable night with pain coming and going  He feels like urination relieves his pain somewhat This am the pain seemed to be ok, but then very severe pain hit him while he was at his office and he decided to come in No hematuria He has felt dizzy since last night was well-it sounds as though he may be having vertigo.  This has improved today as well No personal or family history of kidney stones  He felt nauseated and had cold sweats last night when the pain was more severe-otherwise no fever or chills He uses a heating pad last night which did help him some  No fever, no CP or SOB  Otherwise health maintenance is up-to-date Patient Active Problem List   Diagnosis Date Noted   Inguinal hernia without obstruction or gangrene 70/26/3785   Umbilical hernia without obstruction or gangrene 03/07/2020   PCP NOTES >>> 07/04/2015   Annual physical exam 09/06/2011   HYPOTHYROIDISM 09/27/2009    Past Medical History:  Diagnosis Date   Hypothyroidism     Past Surgical History:  Procedure Laterality Date   SHOULDER  ARTHROSCOPY Left     Social History   Tobacco Use   Smoking status: Never Smoker   Smokeless tobacco: Never Used  Vaping Use   Vaping Use: Never used  Substance Use Topics   Alcohol use: Yes    Alcohol/week: 0.0 standard drinks    Comment: socially    Drug use: No    Family History  Problem Relation Age of Onset   Coronary artery disease Father 77       dx age 81, had CABG   Bladder Cancer Father    Diabetes Other        grandmother, F (late onset)   Colon cancer Neg Hx    Prostate cancer Neg Hx    Stroke Neg Hx    Esophageal cancer Neg Hx    Rectal cancer Neg Hx    Stomach cancer Neg Hx     Allergies  Allergen Reactions   Sulfonamide Derivatives     REACTION: n/v    Medication list has been reviewed and updated.  Current Outpatient Medications on File Prior to Visit  Medication Sig Dispense Refill   acetaminophen (TYLENOL) 325 MG tablet Take 650 mg by mouth every 6 (six) hours as needed.     cholecalciferol (VITAMIN D3) 25 MCG (1000 UT) tablet Take 1,000 Units by mouth daily.  levothyroxine (SYNTHROID) 150 MCG tablet Take 1 tablet (150 mcg total) by mouth daily before breakfast. 90 tablet 3   No current facility-administered medications on file prior to visit.    Review of Systems:  As per HPI- otherwise negative.   Physical Examination: Vitals:   03/22/20 1125  BP: 130/90  Pulse: 61  Resp: 17  Temp: 97.6 F (36.4 C)  SpO2: 97%   Vitals:   03/22/20 1125  Weight: 228 lb (103.4 kg)  Height: 6\' 1"  (1.854 m)   Body mass index is 30.08 kg/m. Ideal Body Weight: Weight in (lb) to have BMI = 25: 189.1  GEN: no acute distress but appears to be uncomfortable HEENT: Atraumatic, Normocephalic.  Ears and Nose: No external deformity. CV: RRR, No M/G/R. No JVD. No thrill. No extra heart sounds. PULM: CTA B, no wheezes, crackles, rhonchi. No retractions. No resp. distress. No accessory muscle use. ABD: S, NT, ND, +BS. No rebound. No  HSM.  Belly is benign. Pt has umbilical hernia which is non tender on exam-patient has plans for surgical repair Left flank is not tender to exam, no skin lesions Normal TL flexion and extension, normal BLE strength, sensation, DTR, negative SLR EXTR: No c/c/e PSYCH: Normally interactive. Conversant.  Performed Dix-Hallpike maneuver which was slightly positive bilaterally.  Patient notes his vertigo symptoms are already significantly better   Assessment and Plan: Acute back pain, unspecified back location, unspecified back pain laterality - Plan: Urine Culture, POCT urinalysis dipstick, Urine Microscopic Only, tamsulosin (FLOMAX) 0.4 MG CAPS capsule, meloxicam (MOBIC) 7.5 MG tablet, HYDROcodone-acetaminophen (NORCO/VICODIN) 5-325 MG tablet  Abdominal pain, unspecified abdominal location - Plan: CT RENAL STONE STUDY  Left flank pain - Plan: CBC, Comprehensive metabolic panel  Here today with flank pain history suspicious for nephrolithiasis Start on NSAID, flomax, pain medication as needed for pain Set up CT renal for later today Labs pending Discussed possible courses of action depending on size and location of stone.  If small stone likely will pass spontaneously, if larger stone referral to nephrology for further treatment may be needed Patient and his companion state understanding of and agreement with plan.  They will seek care if any worsening in the meantime  This visit occurred during the SARS-CoV-2 public health emergency.  Safety protocols were in place, including screening questions prior to the visit, additional usage of staff PPE, and extensive cleaning of exam room while observing appropriate contact time as indicated for disinfecting solutions.   Meds ordered this encounter  Medications   tamsulosin (FLOMAX) 0.4 MG CAPS capsule    Sig: Take 1 capsule (0.4 mg total) by mouth daily. Use as needed to pass kidney stone    Dispense:  30 capsule    Refill:  1   meloxicam  (MOBIC) 7.5 MG tablet    Sig: Take 1-2 tablets (7.5-15 mg total) by mouth daily.    Dispense:  30 tablet    Refill:  0   HYDROcodone-acetaminophen (NORCO/VICODIN) 5-325 MG tablet    Sig: Take 1-2 tablets by mouth every 8 (eight) hours as needed for up to 5 days.    Dispense:  20 tablet    Refill:  0    Signed Lamar Blinks, MD 3:30 scan  Received his labs as below, message to patient  Results for orders placed or performed in visit on 03/22/20  Urine Microscopic Only  Result Value Ref Range   WBC, UA 0-2/hpf 0-2/hpf   RBC / HPF 0-2/hpf 0-2/hpf   Squamous  Epithelial / LPF Rare(0-4/hpf) Rare(0-4/hpf)   Amorphous Present (A) None;Present  CBC  Result Value Ref Range   WBC 7.6 4.0 - 10.5 K/uL   RBC 4.68 4.22 - 5.81 Mil/uL   Platelets 300.0 150 - 400 K/uL   Hemoglobin 13.5 13.0 - 17.0 g/dL   HCT 41.1 39 - 52 %   MCV 87.8 78.0 - 100.0 fl   MCHC 32.8 30.0 - 36.0 g/dL   RDW 13.4 11.5 - 15.5 %  Comprehensive metabolic panel  Result Value Ref Range   Sodium 140 135 - 145 mEq/L   Potassium 4.9 3.5 - 5.1 mEq/L   Chloride 107 96 - 112 mEq/L   CO2 28 19 - 32 mEq/L   Glucose, Bld 104 (H) 70 - 99 mg/dL   BUN 15 6 - 23 mg/dL   Creatinine, Ser 1.19 0.40 - 1.50 mg/dL   Total Bilirubin 0.7 0.2 - 1.2 mg/dL   Alkaline Phosphatase 64 39 - 117 U/L   AST 23 0 - 37 U/L   ALT 23 0 - 53 U/L   Total Protein 6.8 6.0 - 8.3 g/dL   Albumin 4.1 3.5 - 5.2 g/dL   GFR 62.55 >60.00 mL/min   Calcium 9.1 8.4 - 10.5 mg/dL  POCT urinalysis dipstick  Result Value Ref Range   Color, UA yellow yellow   Clarity, UA clear clear   Glucose, UA negative negative mg/dL   Bilirubin, UA negative negative   Ketones, POC UA negative negative mg/dL   Spec Grav, UA >=1.030 (A) 1.010 - 1.025   Blood, UA moderate (A) negative   pH, UA 5.5 5.0 - 8.0   Protein Ur, POC trace (A) negative mg/dL   Urobilinogen, UA 0.2 0.2 or 1.0 E.U./dL   Nitrite, UA Negative Negative   Leukocytes, UA Negative Negative    Received his CT scan and called pt 8 pm 4 mm ureteral stone is likely culprit.  This should be able to pass.  There is also a stone in the left kidney which may or may not ever be symptomatic Patient know is aware of both inguinal and umbilical hernias and has plans for surgical repair He notes his pain is under good control currently, he is relatively comfortable.  I advised he may want to pick up a stone screen at the drugstore in order to screen his urine.  He will let me know if stone not passed in the next day or 2 CT RENAL STONE STUDY  Result Date: 03/22/2020 CLINICAL DATA:  Microhematuria, intermittent left flank pain since yesterday EXAM: CT ABDOMEN AND PELVIS WITHOUT CONTRAST TECHNIQUE: Multidetector CT imaging of the abdomen and pelvis was performed following the standard protocol without IV contrast. COMPARISON:  None. FINDINGS: Lower chest: No acute pleural or parenchymal lung disease. Hepatobiliary: No focal liver abnormality is seen. No gallstones, gallbladder wall thickening, or biliary dilatation. Pancreas: Unremarkable. No pancreatic ductal dilatation or surrounding inflammatory changes. Spleen: Normal in size without focal abnormality. Adrenals/Urinary Tract: There are 3 distinct punctate less than 3 mm nonobstructing calculi lower pole left kidney. There is also a nonobstructing 4 mm distal left ureteral calculus reference image 95. No left-sided obstructive uropathy. The right kidney and ureter are unremarkable. Bladder is minimally distended with no focal abnormality. The adrenals are normal. Stomach/Bowel: No bowel obstruction or ileus. Normal appendix right lower quadrant. Portions of the proximal sigmoid colon extend into a left inguinal hernia, with no evidence of bowel incarceration. No wall thickening or inflammatory change. Vascular/Lymphatic:  Aortic atherosclerosis. No enlarged abdominal or pelvic lymph nodes. Reproductive: Prostate is unremarkable. Other: No free fluid or free  gas. Small fat containing umbilical hernia. Left inguinal hernia contains a portion of the proximal sigmoid colon without bowel incarceration or strangulation. Musculoskeletal: No acute or destructive bony lesions. Reconstructed images demonstrate no additional findings. IMPRESSION: 1. Nonobstructing 4 mm distal left ureteral calculus. 2. Nonobstructing left renal calculi. 3. Left inguinal hernia containing a portion of the proximal sigmoid colon without bowel incarceration or strangulation. 4. Aortic Atherosclerosis (ICD10-I70.0). Electronically Signed   By: Randa Ngo M.D.   On: 03/22/2020 19:46

## 2020-03-22 NOTE — Telephone Encounter (Signed)
Nurse Assessment Nurse: Markus Daft, RN, Sherre Poot Date/Time (Eastern Time): 03/22/2020 10:13:56 AM Confirm and document reason for call. If symptomatic, describe symptoms. ---Caller states he is having severe lower left back pain, and dizziness. S/S started last night while in kitchen with pain suddenly dropping him to the floor. Then subsided an hr later. Then dizziness started, and continued this am, and another wave of pain. Urinating helped relieve it some. Denies pain with urination. No blood in urine. No fever. Has the patient had close contact with a person known or suspected to have the novel coronavirus illness OR traveled / lives in area with major community spread (including international travel) in the last 14 days from the onset of symptoms? * If Asymptomatic, screen for exposure and travel within the last 14 days. ---No Does the patient have any new or worsening symptoms? ---Yes Will a triage be completed? ---Yes Related visit to physician within the last 2 weeks? ---No Does the PT have any chronic conditions? (i.e. diabetes, asthma, this includes High risk factors for pregnancy, etc.) ---Yes List chronic conditions. ---hypothyroid Is this a behavioral health or substance abuse call? ---No Guidelines Guideline Title Affirmed Question Affirmed Notes Nurse Date/Time (Eastern Time) Flank Pain Patient sounds very sick or weak to the triager Markus Daft, RN, Sherre Poot 03/22/2020 10:16:01 AMPLEASE NOTE: All timestamps contained within this report are represented as [place] Standard Time. CONFIDENTIALTY NOTICE: This fax transmission is intended only for the addressee. It contains information that is legally privileged, confidential or otherwise protected from use or disclosure. If you are not the intended recipient, you are strictly prohibited from reviewing, disclosing, copying using or disseminating any of this information or taking any action in reliance on or regarding this information. If  you have received this fax in error, please notify us immediately by telephone so that we can arrange for its return to [place]. Phone: [phone], Toll-Free: [phone], Fax: [phone] Page: 2 of 2 Call Id: [id number] [place]. Time Eilene Ghazi Time) Disposition Final User 03/22/2020 10:27:07 AM Called On-Call Provider Markus Daft, RN, Sherre Poot Reason: Judie Petit at office notified, and no appts available and warm conf. to office. Aware to be seen within the hr. MD could see him at 11:20 am. 03/22/2020 10:18:09 AM Go to ED Now (or PCP triage) Yes Markus Daft, RN, Kenton Kingfisher Disagree/Comply Comply Caller Understands Yes PreDisposition Did not know what to do Care Advice Given Per Guideline GO TO ED NOW (OR PCP TRIAGE): * IF NO PCP (PRIMARY CARE PROVIDER) SECOND-LEVEL TRIAGE: You need to be seen within the next hour. Go to the [facility] at _____________ [place] as soon as you can. ANOTHER ADULT SHOULD DRIVE: * It is better and safer if another adult drives instead of you. CARE ADVICE given per Flank Pain (Adult) guideline. Comments User: Mayford Knife, RN Date/Time Eilene Ghazi Time): 03/22/2020 10:16:27 AM Rates pain level now at 3/10, but 30 min ago pain level was at 8-10/10. Referrals Warm transfer to backline

## 2020-03-23 LAB — URINE CULTURE
MICRO NUMBER:: 10732035
Result:: NO GROWTH
SPECIMEN QUALITY:: ADEQUATE

## 2020-03-24 ENCOUNTER — Encounter: Payer: Self-pay | Admitting: Family Medicine

## 2020-04-08 ENCOUNTER — Ambulatory Visit: Payer: Self-pay | Admitting: Surgery

## 2020-04-08 ENCOUNTER — Encounter: Payer: Self-pay | Admitting: Surgery

## 2020-04-08 NOTE — H&P (Signed)
General Surgery - [facility]  Brad Stewart DOB: 11/16/1960 Single / Language: English / Race: White Male   History of Present Illness  The patient is a 59 year old male who presents with an inguinal hernia.  CHIEF COMPLAINT: left inguinal hernia, umbilical hernia  The patient is referred by Dr. Jos Paz for surgical evaluation and management of left inguinal hernia. Patient states that he first noted the hernia approximately 2 years ago. It has gradually increased in size. It causes intermittent discomfort. It is always been fully reducible. During the day every, more prominent and the patient notes that he is supporting it with his left hand more often. He denies any change in his bowel habits. He has had no prior abdominal surgery. He has had no prior hernia repairs. Patient is employed in the banking industry. He presents today to discuss hernia repair.   Past Surgical History  Shoulder Surgery  Left.  Diagnostic Studies History  Colonoscopy  within last year  Allergies  Sulfa Antibiotics  Allergies Reconciled   Medication History  Levothyroxine Sodium (150MCG Tablet, Oral) Active. Medications Reconciled  Social History  Alcohol use  Occasional alcohol use. Caffeine use  Tea. No drug use  Tobacco use  Never smoker.  Family History  Arthritis  Mother. Cancer  Father. Heart Disease  Father. Thyroid problems  Mother, Sister.  Other Problems  Hemorrhoids  Thyroid Disease   Review of Systems  General Not Present- Appetite Loss, Chills, Fatigue, Fever, Night Sweats, Weight Gain and Weight Loss. Skin Not Present- Change in Wart/Mole, Dryness, Hives, Jaundice, New Lesions, Non-Healing Wounds, Rash and Ulcer. HEENT Not Present- Earache, Hearing Loss, Hoarseness, Nose Bleed, Oral Ulcers, Ringing in the Ears, Seasonal Allergies, Sinus Pain, Sore Throat, Visual Disturbances, Wears glasses/contact lenses and Yellow  Eyes. Respiratory Not Present- Bloody sputum, Chronic Cough, Difficulty Breathing, Snoring and Wheezing. Breast Not Present- Breast Mass, Breast Pain, Nipple Discharge and Skin Changes. Cardiovascular Not Present- Chest Pain, Difficulty Breathing Lying Down, Leg Cramps, Palpitations, Rapid Heart Rate, Shortness of Breath and Swelling of Extremities. Gastrointestinal Not Present- Abdominal Pain, Bloating, Bloody Stool, Change in Bowel Habits, Chronic diarrhea, Constipation, Difficulty Swallowing, Excessive gas, Gets full quickly at meals, Hemorrhoids, Indigestion, Nausea, Rectal Pain and Vomiting. Male Genitourinary Not Present- Blood in Urine, Change in Urinary Stream, Frequency, Impotence, Nocturia, Painful Urination, Urgency and Urine Leakage. Musculoskeletal Not Present- Back Pain, Joint Pain, Joint Stiffness, Muscle Pain, Muscle Weakness and Swelling of Extremities. Neurological Not Present- Decreased Memory, Fainting, Headaches, Numbness, Seizures, Tingling, Tremor, Trouble walking and Weakness. Psychiatric Not Present- Anxiety, Bipolar, Change in Sleep Pattern, Depression, Fearful and Frequent crying. Endocrine Not Present- Cold Intolerance, Excessive Hunger, Hair Changes, Heat Intolerance, Hot flashes and New Diabetes. Hematology Not Present- Blood Thinners, Easy Bruising, Excessive bleeding, Gland problems, HIV and Persistent Infections.  Vitals  Weight: 229.5 lb Height: 72in Body Surface Area: 2.26 m Body Mass Index: 31.13 kg/m  Temp.: 98F  Pulse: 88 (Regular)  BP: 122/78(Sitting, Left Arm, Standard)  Physical Exam  GENERAL APPEARANCE Development: normal Nutritional status: normal Gross deformities: none  SKIN Rash, lesions, ulcers: none Induration, erythema: none Nodules: none palpable  EYES Conjunctiva and lids: normal Pupils: equal and reactive Iris: normal bilaterally  EARS, NOSE, MOUTH, THROAT External ears: no lesion or deformity External nose: no  lesion or deformity Hearing: grossly normal Due to Covid-19 pandemic, patient is wearing a mask.  NECK Symmetric: yes Trachea: midline Thyroid: no palpable nodules in the thyroid bed    CHEST Respiratory effort: normal Retraction or accessory muscle use: no Breath sounds: normal bilaterally Rales, rhonchi, wheeze: none  CARDIOVASCULAR Auscultation: regular rhythm, normal rate Murmurs: none Pulses: radial pulse 2+ palpable Lower extremity edema: none  ABDOMEN Distension: none Masses: none palpable Tenderness: none Hepatosplenomegaly: not present Hernia: Small to moderate sized umbilical hernia containing adipose tissue, partially reducible, fascial defect approximately 1 cm in size  GENITOURINARY Penis: no lesions Scrotum: no masses Palpation in the right inguinal canal with cough and Valsalva shows no sign of hernia. Palpation in the left inguinal canal shows some moderate sized left inguinal hernia. This is mildly tender. It is reducible with some manipulation. It augments with cough and Valsalva.  MUSCULOSKELETAL Station and gait: normal Digits and nails: no clubbing or cyanosis Muscle strength: grossly normal all extremities Range of motion: grossly normal all extremities Deformity: none  LYMPHATIC Cervical: none palpable Supraclavicular: none palpable  PSYCHIATRIC Oriented to person, place, and time: yes Mood and affect: normal for situation Judgment and insight: appropriate for situation    Assessment & Plan   INGUINAL HERNIA OF LEFT SIDE WITHOUT OBSTRUCTION OR GANGRENE (K40.90) UMBILICAL HERNIA WITHOUT OBSTRUCTION OR GANGRENE (K42.9)  Pt Education - Pamphlet Given - Hernia Surgery: discussed with patient and provided information. Patient is referred by his primary care physician for surgical evaluation and management of left inguinal hernia. Patient is found on physical examination to have an umbilical hernia as well as a moderately large left inguinal  hernia. Patient is provided with written literature on hernia surgery to review at home.  We discussed concurrent repair of both a left inguinal hernia and the umbilical hernia. Certainly the left inguinal hernia repair is the larger of the 2 procedures. We discussed doing an open left inguinal hernia with mesh as the procedure of choice. We would use a mesh patch for the umbilical hernia repair. Risk of recurrence for the left inguinal hernia is approximately 2% following 6 weeks of recuperation. The risk of recurrence with the umbilical hernia was approximately 5%. We discussed restrictions on his activities after surgery. The patient understands and wishes to proceed with surgery at a time convenient for him in the near future.  We also discussed signs and symptoms of incarceration or strangulation. If any of these symptoms were to develop, the patient should contact my office immediately.  The risks and benefits of the procedure have been discussed at length with the patient. The patient understands the proposed procedure, potential alternative treatments, and the course of recovery to be expected. All of the patient's questions have been answered at this time. The patient wishes to proceed with surgery.  Delvin Hedeen, MD [facility] Office: [phone]    

## 2020-04-13 ENCOUNTER — Other Ambulatory Visit: Payer: Self-pay | Admitting: Family Medicine

## 2020-04-13 DIAGNOSIS — M549 Dorsalgia, unspecified: Secondary | ICD-10-CM

## 2020-06-12 ENCOUNTER — Encounter (HOSPITAL_COMMUNITY): Payer: Self-pay

## 2020-06-12 NOTE — Patient Instructions (Signed)
DUE TO COVID-19 ONLY ONE VISITOR IS ALLOWED TO COME WITH YOU AND STAY IN THE WAITING ROOM ONLY  DURING PRE OP AND PROCEDURE.   IF YOU WILL BE ADMITTED INTO THE HOSPITAL YOU ARE ALLOWED ONE SUPPORT PERSON DURING  VISITATION HOURS ONLY (10AM -8PM)   . The support person may change daily. . The support person must pass our screening, gel in and out, and wear a mask at all times, including in the patient's room. . Patients must also wear a mask when staff or their support person are in the room.   COVID SWAB TESTING MUST BE COMPLETED ON:   Tuesday, 06-20-20 @   [address] [place], [place] [address]  (Must self quarantine after testing. Follow instructions on  handout.)        Your procedure is scheduled on:  Friday, 06-23-20   Report to [facility] Main  Entrance   Report to Short Stay at 5:30 AM   [facility])   Call this number if you have problems the morning of surgery [phone]   Do not eat food :After Midnight.   May have liquids until 4:30 AM  day of surgery  CLEAR LIQUID DIET  Foods Allowed                                                                     Foods Excluded  Water, Black Coffee and tea, regular and decaf                liquids that you cannot  Plain Jell-O in any flavor  (No red)                                      see through such as: Fruit ices (not with fruit pulp)                                      milk, soups, orange juice              Iced Popsicles (No red)                                      All solid food                                   Apple juices Sports drinks like Gatorade (No red) Lightly seasoned clear broth or consume(fat free) Sugar, honey syrup  S  Oral Hygiene is also important to reduce your risk of infection.                                    Remember - BRUSH YOUR TEETH THE MORNING OF SURGERY WITH YOUR REGULAR TOOTHPASTE   Do NOT smoke after Midnight   Take these medicines the morning of surgery with A SIP OF  WATER:  Levothyroxine                                You may not have any metal on your body including jewelry, and body piercings             Do not wear lotions, powders, perfumes/cologne, or deodorant             Men may shave face and neck.   Do not bring valuables to the hospital. [place].   Contacts, dentures or bridgework may not be worn into surgery.   Patients discharged the day of surgery will not be allowed to drive home.                Please read over the following fact sheets you were given: IF YOU HAVE QUESTIONS ABOUT YOUR PRE OP INSTRUCTIONS PLEASE CALL [phone]   [facility] - Preparing for Surgery Before surgery, you can play an important role.  Because skin is not sterile, your skin needs to be as free of germs as possible.  You can reduce the number of germs on your skin by washing with CHG (chlorahexidine gluconate) soap before surgery.  CHG is an antiseptic cleaner which kills germs and bonds with the skin to continue killing germs even after washing. Please DO NOT use if you have an allergy to CHG or antibacterial soaps.  If your skin becomes reddened/irritated stop using the CHG and inform your nurse when you arrive at Short Stay. Do not shave (including legs and underarms) for at least 48 hours prior to the first CHG shower.  You may shave your face/neck.  Please follow these instructions carefully:  1.  Shower with CHG Soap the night before surgery and the  morning of surgery.  2.  If you choose to wash your hair, wash your hair first as usual with your normal  shampoo.  3.  After you shampoo, rinse your hair and body thoroughly to remove the shampoo.                             4.  Use CHG as you would any other liquid soap.  You can apply chg directly to the skin and wash.  Gently with a scrungie or clean washcloth.  5.  Apply the CHG Soap to your body ONLY FROM THE NECK DOWN.   Do   not use on face/ open                            Wound or open sores. Avoid contact with eyes, ears mouth and   genitals (private parts).                       Wash face,  Genitals (private parts) with your normal soap.             6.  Wash thoroughly, paying special attention to the area where your    surgery  will be performed.  7.  Thoroughly rinse your body with warm water from the neck down.  8.  DO NOT shower/wash with your normal soap after using and rinsing off the CHG Soap.                9.  Pat yourself  dry with a clean towel.            10.  Wear clean pajamas.            11.  Place clean sheets on your bed the night of your first shower and do not  sleep with pets. Day of Surgery : Do not apply any lotions/deodorants the morning of surgery.  Please wear clean clothes to the hospital/surgery center.  FAILURE TO FOLLOW THESE INSTRUCTIONS MAY RESULT IN THE CANCELLATION OF YOUR SURGERY  PATIENT SIGNATURE_________________________________  NURSE SIGNATURE__________________________________  ________________________________________________________________________

## 2020-06-12 NOTE — Progress Notes (Signed)
COVID Vaccine Completed: Date COVID Vaccine completed: COVID vaccine manufacturer: [place]   PCP - Kathlene November, MD Cardiologist -   Chest x-ray -  EKG - 08-05-19 in Epic  Stress Test -  ECHO -  Cardiac Cath -  Pacemaker/ICD device last checked:  Sleep Study -  CPAP -   Fasting Blood Sugar -  Checks Blood Sugar _____ times a day  Blood Thinner Instructions: Aspirin Instructions: Last Dose:  Anesthesia review:   Patient denies shortness of breath, fever, cough and chest pain at PAT appointment   Patient verbalized understanding of instructions that were given to them at the PAT appointment. Patient was also instructed that they will need to review over the PAT instructions again at home before surgery.

## 2020-06-16 ENCOUNTER — Encounter (HOSPITAL_COMMUNITY)
Admission: RE | Admit: 2020-06-16 | Discharge: 2020-06-16 | Disposition: A | Discharge status: Home / Self Care | Payer: BC Managed Care – PPO | Source: Ambulatory Visit | Attending: Surgery | Admitting: Surgery

## 2020-06-16 ENCOUNTER — Encounter (HOSPITAL_COMMUNITY): Payer: Self-pay

## 2020-06-16 ENCOUNTER — Other Ambulatory Visit: Payer: Self-pay

## 2020-06-16 DIAGNOSIS — Z01812 Encounter for preprocedural laboratory examination: Secondary | ICD-10-CM | POA: Insufficient documentation

## 2020-06-16 HISTORY — DX: Personal history of urinary calculi: Z87.442

## 2020-06-16 LAB — CBC
HCT: 40.9 % (ref 39.0–52.0)
Hemoglobin: 13.6 g/dL (ref 13.0–17.0)
MCH: 29.1 pg (ref 26.0–34.0)
MCHC: 33.3 g/dL (ref 30.0–36.0)
MCV: 87.4 fL (ref 80.0–100.0)
Platelets: 299 10*3/uL (ref 150–400)
RBC: 4.68 MIL/uL (ref 4.22–5.81)
RDW: 13.6 % (ref 11.5–15.5)
WBC: 5.8 10*3/uL (ref 4.0–10.5)
nRBC: 0 % (ref 0.0–0.2)

## 2020-06-16 NOTE — Progress Notes (Signed)
COVID Vaccine Completed: NO Date COVID Vaccine completed: COVID vaccine manufacturer: [place]   PCP - Kathlene November, MD Cardiologist -   Chest x-ray -  EKG - 08-05-19 in Epic  Stress Test -  ECHO -  Cardiac Cath -  Pacemaker/ICD device last checked:  Sleep Study -  CPAP -   Fasting Blood Sugar -  Checks Blood Sugar _____ times a day  Blood Thinner Instructions: Aspirin Instructions: Last Dose:  Anesthesia review:    Patient denies shortness of breath, fever, cough and chest pain at PAT appointment  NONE   Patient verbalized understanding of instructions that were given to them at the PAT appointment. Patient was also instructed that they will need to review over the PAT instructions again at home before surgery.

## 2020-06-17 ENCOUNTER — Encounter (HOSPITAL_COMMUNITY): Payer: Self-pay | Admitting: Surgery

## 2020-06-17 NOTE — H&P (Signed)
General Surgery [facility]  Brad Stewart DOB: 07-Jul-1961 Single / Language: Brad Stewart / Race: White Male   History of Present Illness  The patient is a 59 year old male who presents with an inguinal hernia.  CHIEF COMPLAINT: left inguinal hernia, umbilical hernia  The patient is referred by Dr. Belinda Stewart for surgical evaluation and management of left inguinal hernia. Patient states that he first noted the hernia approximately 2 years ago. It has gradually increased in size. It causes intermittent discomfort. It is always been fully reducible. During the day every, more prominent and the patient notes that he is supporting it with his left hand more often. He denies any change in his bowel habits. He has had no prior abdominal surgery. He has had no prior hernia repairs. Patient is employed in the [employer]. He presents today to discuss hernia repair.   Past Surgical History  Shoulder Surgery  Left.  Diagnostic Studies History  Colonoscopy  within last year  Allergies  Sulfa Antibiotics  Allergies Reconciled   Medication History  Levothyroxine Sodium (150MCG Tablet, Oral) Active. Medications Reconciled  Social History  Alcohol use  Occasional alcohol use. Caffeine use  Tea. No drug use  Tobacco use  Never smoker.  Family History  Arthritis  Mother. Cancer  Father. Heart Disease  Father. Thyroid problems  Mother, Sister.  Other Problems  Hemorrhoids  Thyroid Disease   Review of Systems  General Not Present- Appetite Loss, Chills, Fatigue, Fever, Night Sweats, Weight Gain and Weight Loss. Skin Not Present- Change in Wart/Mole, Dryness, Hives, Jaundice, New Lesions, Non-Healing Wounds, Rash and Ulcer. HEENT Not Present- Earache, Hearing Loss, Hoarseness, Nose Bleed, Oral Ulcers, Ringing in the Ears, Seasonal Allergies, Sinus Pain, Sore Throat, Visual Disturbances, Wears glasses/contact lenses and Yellow  Eyes. Respiratory Not Present- Bloody sputum, Chronic Cough, Difficulty Breathing, Snoring and Wheezing. Breast Not Present- Breast Mass, Breast Pain, Nipple Discharge and Skin Changes. Cardiovascular Not Present- Chest Pain, Difficulty Breathing Lying Down, Leg Cramps, Palpitations, Rapid Heart Rate, Shortness of Breath and Swelling of Extremities. Gastrointestinal Not Present- Abdominal Pain, Bloating, Bloody Stool, Change in Bowel Habits, Chronic diarrhea, Constipation, Difficulty Swallowing, Excessive gas, Gets full quickly at meals, Hemorrhoids, Indigestion, Nausea, Rectal Pain and Vomiting. Male Genitourinary Not Present- Blood in Urine, Change in Urinary Stream, Frequency, Impotence, Nocturia, Painful Urination, Urgency and Urine Leakage. Musculoskeletal Not Present- Back Pain, Joint Pain, Joint Stiffness, Muscle Pain, Muscle Weakness and Swelling of Extremities. Neurological Not Present- Decreased Memory, Fainting, Headaches, Numbness, Seizures, Tingling, Tremor, Trouble walking and Weakness. Psychiatric Not Present- Anxiety, Bipolar, Change in Sleep Pattern, Depression, Fearful and Frequent crying. Endocrine Not Present- Cold Intolerance, Excessive Hunger, Hair Changes, Heat Intolerance, Hot flashes and New Diabetes. Hematology Not Present- Blood Thinners, Easy Bruising, Excessive bleeding, Gland problems, HIV and Persistent Infections.  Vitals  Weight: 229.5 lb Height: 72in Body Surface Area: 2.26 m Body Mass Index: 31.13 kg/m  Temp.: 66F  Pulse: 88 (Regular)  BP: 122/78(Sitting, Left Arm, Standard)  Physical Exam  GENERAL APPEARANCE Development: normal Nutritional status: normal Gross deformities: none  SKIN Rash, lesions, ulcers: none Induration, erythema: none Nodules: none palpable  EYES Conjunctiva and lids: normal Pupils: equal and reactive Iris: normal bilaterally  EARS, NOSE, MOUTH, THROAT External ears: no lesion or deformity External nose: no  lesion or deformity Hearing: grossly normal Due to Covid-19 pandemic, patient is wearing a mask.  NECK Symmetric: yes Trachea: midline Thyroid: no palpable nodules in the thyroid bed  CHEST Respiratory effort: normal Retraction or accessory muscle use: no Breath sounds: normal bilaterally Rales, rhonchi, wheeze: none  CARDIOVASCULAR Auscultation: regular rhythm, normal rate Murmurs: none Pulses: radial pulse 2+ palpable Lower extremity edema: none  ABDOMEN Distension: none Masses: none palpable Tenderness: none Hepatosplenomegaly: not present Hernia: Small to moderate sized umbilical hernia containing adipose tissue, partially reducible, fascial defect approximately 1 cm in size  GENITOURINARY Penis: no lesions Scrotum: no masses Palpation in the right inguinal canal with cough and Valsalva shows no sign of hernia. Palpation in the left inguinal canal shows some moderate sized left inguinal hernia. This is mildly tender. It is reducible with some manipulation. It augments with cough and Valsalva.  MUSCULOSKELETAL Station and gait: normal Digits and nails: no clubbing or cyanosis Muscle strength: grossly normal all extremities Range of motion: grossly normal all extremities Deformity: none  LYMPHATIC Cervical: none palpable Supraclavicular: none palpable  PSYCHIATRIC Oriented to person, place, and time: yes Mood and affect: normal for situation Judgment and insight: appropriate for situation    Assessment & Plan   INGUINAL HERNIA OF LEFT SIDE WITHOUT OBSTRUCTION OR GANGRENE (T46.56) UMBILICAL HERNIA WITHOUT OBSTRUCTION OR GANGRENE (K42.9)  Pt Education - Pamphlet Given - Hernia Surgery: discussed with patient and provided information. Patient is referred by his primary care physician for surgical evaluation and management of left inguinal hernia. Patient is found on physical examination to have an umbilical hernia as well as a moderately large left inguinal  hernia. Patient is provided with written literature on hernia surgery to review at home.  We discussed concurrent repair of both a left inguinal hernia and the umbilical hernia. Certainly the left inguinal hernia repair is the larger of the 2 procedures. We discussed doing an open left inguinal hernia with mesh as the procedure of choice. We would use a mesh patch for the umbilical hernia repair. Risk of recurrence for the left inguinal hernia is approximately 2% following 6 weeks of recuperation. The risk of recurrence with the umbilical hernia was approximately 5%. We discussed restrictions on his activities after surgery. The patient understands and wishes to proceed with surgery at a time convenient for him in the near future.  We also discussed signs and symptoms of incarceration or strangulation. If any of these symptoms were to develop, the patient should contact my office immediately.  The risks and benefits of the procedure have been discussed at length with the patient. The patient understands the proposed procedure, potential alternative treatments, and the course of recovery to be expected. All of the patient's questions have been answered at this time. The patient wishes to proceed with surgery.  Armandina Gemma, MD [facility] Office: [phone]

## 2020-06-20 ENCOUNTER — Other Ambulatory Visit (HOSPITAL_COMMUNITY)
Admission: RE | Admit: 2020-06-20 | Discharge: 2020-06-20 | Disposition: A | Discharge status: Home / Self Care | Payer: BC Managed Care – PPO | Source: Ambulatory Visit | Attending: Surgery | Admitting: Surgery

## 2020-06-20 DIAGNOSIS — Z01812 Encounter for preprocedural laboratory examination: Secondary | ICD-10-CM | POA: Diagnosis not present

## 2020-06-20 DIAGNOSIS — Z20822 Contact with and (suspected) exposure to covid-19: Secondary | ICD-10-CM | POA: Insufficient documentation

## 2020-06-20 LAB — SARS CORONAVIRUS 2 (TAT 6-24 HRS): SARS Coronavirus 2: NEGATIVE

## 2020-06-22 NOTE — Anesthesia Preprocedure Evaluation (Addendum)
Anesthesia Evaluation  Patient identified by MRN, date of birth, ID band Patient awake    Reviewed: Allergy & Precautions, NPO status , Patient's Chart, lab work & pertinent test results  Airway Mallampati: II  TM Distance: >3 FB Neck ROM: Full    Dental no notable dental hx. (+) Dental Advisory Given   Pulmonary neg pulmonary ROS,    Pulmonary exam normal breath sounds clear to auscultation       Cardiovascular negative cardio ROS Normal cardiovascular exam Rhythm:Regular Rate:Normal     Neuro/Psych negative neurological ROS     GI/Hepatic negative GI ROS, Neg liver ROS,   Endo/Other  Hypothyroidism   Renal/GU negative Renal ROS     Musculoskeletal negative musculoskeletal ROS (+)   Abdominal   Peds  Hematology negative hematology ROS (+)   Anesthesia Other Findings   Reproductive/Obstetrics                            Anesthesia Physical Anesthesia Plan  ASA: II  Anesthesia Plan: General   Post-op Pain Management:    Induction: Intravenous  PONV Risk Score and Plan: 3 and Ondansetron, Dexamethasone, Treatment may vary due to age or medical condition and Midazolam  Airway Management Planned: Oral ETT  Additional Equipment: None  Intra-op Plan:   Post-operative Plan: Extubation in OR  Informed Consent: I have reviewed the patients History and Physical, chart, labs and discussed the procedure including the risks, benefits and alternatives for the proposed anesthesia with the patient or authorized representative who has indicated his/her understanding and acceptance.     Dental advisory given  Plan Discussed with: CRNA  Anesthesia Plan Comments:        Anesthesia Quick Evaluation

## 2020-06-23 ENCOUNTER — Ambulatory Visit (HOSPITAL_COMMUNITY): Payer: BC Managed Care – PPO | Admitting: Certified Registered"

## 2020-06-23 ENCOUNTER — Encounter (HOSPITAL_COMMUNITY)
Admission: RE | Disposition: A | Discharge status: Home / Self Care | Payer: Self-pay | Source: Home / Self Care | Attending: Surgery

## 2020-06-23 ENCOUNTER — Ambulatory Visit (HOSPITAL_COMMUNITY)
Admission: RE | Admit: 2020-06-23 | Discharge: 2020-06-23 | Disposition: A | Discharge status: Home / Self Care | Payer: BC Managed Care – PPO | Attending: Surgery | Admitting: Surgery

## 2020-06-23 ENCOUNTER — Encounter (HOSPITAL_COMMUNITY): Payer: Self-pay | Admitting: Surgery

## 2020-06-23 DIAGNOSIS — E039 Hypothyroidism, unspecified: Secondary | ICD-10-CM | POA: Insufficient documentation

## 2020-06-23 DIAGNOSIS — K429 Umbilical hernia without obstruction or gangrene: Secondary | ICD-10-CM

## 2020-06-23 DIAGNOSIS — K4090 Unilateral inguinal hernia, without obstruction or gangrene, not specified as recurrent: Secondary | ICD-10-CM | POA: Diagnosis not present

## 2020-06-23 DIAGNOSIS — Z79890 Hormone replacement therapy (postmenopausal): Secondary | ICD-10-CM | POA: Diagnosis not present

## 2020-06-23 HISTORY — PX: UMBILICAL HERNIA REPAIR: SHX196

## 2020-06-23 HISTORY — PX: INGUINAL HERNIA REPAIR: SHX194

## 2020-06-23 SURGERY — REPAIR, HERNIA, INGUINAL, ADULT
Anesthesia: General

## 2020-06-23 MED ORDER — PROPOFOL 10 MG/ML IV BOLUS
INTRAVENOUS | Status: AC
  Filled 2020-06-23: qty 40

## 2020-06-23 MED ORDER — MIDAZOLAM HCL 2 MG/2ML IJ SOLN
INTRAMUSCULAR | Status: DC | PRN
  Administered 2020-06-23: 2 mg via INTRAVENOUS

## 2020-06-23 MED ORDER — ONDANSETRON HCL 4 MG/2ML IJ SOLN
INTRAMUSCULAR | Status: AC
  Filled 2020-06-23: qty 2

## 2020-06-23 MED ORDER — OXYCODONE HCL 5 MG PO TABS: 5.0000 mg | ORAL_TABLET | Freq: Once | ORAL | Status: DC | PRN

## 2020-06-23 MED ORDER — TRAMADOL HCL 50 MG PO TABS: 50.0000 mg | ORAL_TABLET | Freq: Four times a day (QID) | ORAL | 0 refills | Status: DC | PRN

## 2020-06-23 MED ORDER — KETAMINE HCL 10 MG/ML IJ SOLN
INTRAMUSCULAR | Status: DC | PRN
  Administered 2020-06-23 (×2): 20 mg via INTRAVENOUS

## 2020-06-23 MED ORDER — KETAMINE HCL 10 MG/ML IJ SOLN
INTRAMUSCULAR | Status: AC
  Filled 2020-06-23: qty 1

## 2020-06-23 MED ORDER — LACTATED RINGERS IV SOLN: INTRAVENOUS | Status: DC

## 2020-06-23 MED ORDER — HYDROMORPHONE HCL 1 MG/ML IJ SOLN: 0.2500 mg | INTRAMUSCULAR | Status: DC | PRN

## 2020-06-23 MED ORDER — LIDOCAINE 20MG/ML (2%) 15 ML SYRINGE OPTIME
INTRAMUSCULAR | Status: DC | PRN
  Administered 2020-06-23: 1.5 mg/kg/h via INTRAVENOUS

## 2020-06-23 MED ORDER — BUPIVACAINE LIPOSOME 1.3 % IJ SUSP
20.0000 mL | Freq: Once | INTRAMUSCULAR | Status: DC
  Filled 2020-06-23: qty 20

## 2020-06-23 MED ORDER — ONDANSETRON HCL 4 MG/2ML IJ SOLN
INTRAMUSCULAR | Status: DC | PRN
  Administered 2020-06-23: 4 mg via INTRAVENOUS

## 2020-06-23 MED ORDER — LIDOCAINE 2% (20 MG/ML) 5 ML SYRINGE
INTRAMUSCULAR | Status: DC | PRN
  Administered 2020-06-23: 60 mg via INTRAVENOUS

## 2020-06-23 MED ORDER — PROPOFOL 10 MG/ML IV BOLUS
INTRAVENOUS | Status: DC | PRN
  Administered 2020-06-23: 200 mg via INTRAVENOUS

## 2020-06-23 MED ORDER — CHLORHEXIDINE GLUCONATE CLOTH 2 % EX PADS: 6.0000 | MEDICATED_PAD | Freq: Once | CUTANEOUS | Status: DC

## 2020-06-23 MED ORDER — DEXAMETHASONE SODIUM PHOSPHATE 10 MG/ML IJ SOLN
INTRAMUSCULAR | Status: AC
  Filled 2020-06-23: qty 1

## 2020-06-23 MED ORDER — ROCURONIUM BROMIDE 10 MG/ML (PF) SYRINGE
PREFILLED_SYRINGE | INTRAVENOUS | Status: DC | PRN
  Administered 2020-06-23: 50 mg via INTRAVENOUS
  Administered 2020-06-23 (×2): 10 mg via INTRAVENOUS
  Administered 2020-06-23: 20 mg via INTRAVENOUS
  Administered 2020-06-23: 10 mg via INTRAVENOUS

## 2020-06-23 MED ORDER — PROMETHAZINE HCL 25 MG/ML IJ SOLN
6.2500 mg | INTRAMUSCULAR | Status: DC | PRN
  Administered 2020-06-23: 6.25 mg via INTRAVENOUS

## 2020-06-23 MED ORDER — SUGAMMADEX SODIUM 200 MG/2ML IV SOLN
INTRAVENOUS | Status: DC | PRN
  Administered 2020-06-23: 200 mg via INTRAVENOUS

## 2020-06-23 MED ORDER — OXYCODONE HCL 5 MG/5ML PO SOLN: 5.0000 mg | Freq: Once | ORAL | Status: DC | PRN

## 2020-06-23 MED ORDER — ORAL CARE MOUTH RINSE: 15.0000 mL | Freq: Once | OROMUCOSAL | Status: AC

## 2020-06-23 MED ORDER — BUPIVACAINE HCL (PF) 0.5 % IJ SOLN
INTRAMUSCULAR | Status: AC
  Filled 2020-06-23: qty 30

## 2020-06-23 MED ORDER — PROMETHAZINE HCL 25 MG/ML IJ SOLN
INTRAMUSCULAR | Status: AC
  Filled 2020-06-23: qty 1

## 2020-06-23 MED ORDER — FENTANYL CITRATE (PF) 100 MCG/2ML IJ SOLN
INTRAMUSCULAR | Status: AC
  Filled 2020-06-23: qty 2

## 2020-06-23 MED ORDER — CEFAZOLIN SODIUM-DEXTROSE 2-4 GM/100ML-% IV SOLN
2.0000 g | INTRAVENOUS | Status: AC
  Administered 2020-06-23: 2 g via INTRAVENOUS
  Filled 2020-06-23: qty 100

## 2020-06-23 MED ORDER — MIDAZOLAM HCL 2 MG/2ML IJ SOLN
INTRAMUSCULAR | Status: AC
  Filled 2020-06-23: qty 2

## 2020-06-23 MED ORDER — MEPERIDINE HCL 50 MG/ML IJ SOLN: 6.2500 mg | INTRAMUSCULAR | Status: DC | PRN

## 2020-06-23 MED ORDER — ROCURONIUM BROMIDE 10 MG/ML (PF) SYRINGE
PREFILLED_SYRINGE | INTRAVENOUS | Status: AC
  Filled 2020-06-23: qty 10

## 2020-06-23 MED ORDER — LIDOCAINE 2% (20 MG/ML) 5 ML SYRINGE
INTRAMUSCULAR | Status: AC
  Filled 2020-06-23: qty 5

## 2020-06-23 MED ORDER — BUPIVACAINE HCL (PF) 0.5 % IJ SOLN
INTRAMUSCULAR | Status: DC | PRN
  Administered 2020-06-23: 20 mL

## 2020-06-23 MED ORDER — BUPIVACAINE LIPOSOME 1.3 % IJ SUSP
INTRAMUSCULAR | Status: DC | PRN
  Administered 2020-06-23: 20 mL

## 2020-06-23 MED ORDER — CHLORHEXIDINE GLUCONATE 0.12 % MT SOLN
15.0000 mL | Freq: Once | OROMUCOSAL | Status: AC
  Administered 2020-06-23: 15 mL via OROMUCOSAL

## 2020-06-23 MED ORDER — FENTANYL CITRATE (PF) 250 MCG/5ML IJ SOLN
INTRAMUSCULAR | Status: DC | PRN
Start: 2020-06-23 — End: 2020-06-23
  Administered 2020-06-23: 25 ug via INTRAVENOUS
  Administered 2020-06-23: 100 ug via INTRAVENOUS

## 2020-06-23 MED ORDER — DEXAMETHASONE SODIUM PHOSPHATE 10 MG/ML IJ SOLN
INTRAMUSCULAR | Status: DC | PRN
  Administered 2020-06-23: 10 mg via INTRAVENOUS

## 2020-06-23 SURGICAL SUPPLY — 44 items
ADH SKN CLS APL DERMABOND .7 (GAUZE/BANDAGES/DRESSINGS) ×4
APL PRP STRL LF DISP 70% ISPRP (MISCELLANEOUS) ×2
BLADE HEX COATED 2.75 (ELECTRODE) IMPLANT
BLADE SURG 15 STRL LF DISP TIS (BLADE) ×2 IMPLANT
BLADE SURG 15 STRL SS (BLADE) ×4
CHLORAPREP W/TINT 26 (MISCELLANEOUS) ×4 IMPLANT
CLOSURE WOUND 1/2 X4 (GAUZE/BANDAGES/DRESSINGS) ×1
COVER SURGICAL LIGHT HANDLE (MISCELLANEOUS) ×4 IMPLANT
COVER WAND RF STERILE (DRAPES) ×4 IMPLANT
DECANTER SPIKE VIAL GLASS SM (MISCELLANEOUS) ×4 IMPLANT
DERMABOND ADVANCED (GAUZE/BANDAGES/DRESSINGS) ×4
DERMABOND ADVANCED .7 DNX12 (GAUZE/BANDAGES/DRESSINGS) ×4 IMPLANT
DRAIN PENROSE 0.5X18 (DRAIN) ×4 IMPLANT
DRAPE LAPAROSCOPIC ABDOMINAL (DRAPES) ×4 IMPLANT
DRAPE LAPAROTOMY TRNSV 102X78 (DRAPES) ×4 IMPLANT
ELECT REM PT RETURN 15FT ADLT (MISCELLANEOUS) ×4 IMPLANT
GAUZE SPONGE 4X4 12PLY STRL (GAUZE/BANDAGES/DRESSINGS) IMPLANT
GLOVE BIOGEL PI IND STRL 7.0 (GLOVE) ×2 IMPLANT
GLOVE BIOGEL PI INDICATOR 7.0 (GLOVE) ×2
GLOVE SURG ORTHO 8.0 STRL STRW (GLOVE) ×4 IMPLANT
GOWN STRL REUS W/TWL XL LVL3 (GOWN DISPOSABLE) ×8 IMPLANT
KIT BASIN OR (CUSTOM PROCEDURE TRAY) ×4 IMPLANT
KIT TURNOVER KIT A (KITS) IMPLANT
MARKER SKIN DUAL TIP RULER LAB (MISCELLANEOUS) ×4 IMPLANT
MESH ULTRAPRO 3X6 7.6X15CM (Mesh General) ×4 IMPLANT
MESH VENTRALEX ST 1-7/10 CRC S (Mesh General) ×4 IMPLANT
NEEDLE HYPO 22GX1.5 SAFETY (NEEDLE) ×4 IMPLANT
NEEDLE HYPO 25X1 1.5 SAFETY (NEEDLE) ×4 IMPLANT
NS IRRIG 1000ML POUR BTL (IV SOLUTION) ×4 IMPLANT
PACK BASIC VI WITH GOWN DISP (CUSTOM PROCEDURE TRAY) ×4 IMPLANT
PACK GENERAL/GYN (CUSTOM PROCEDURE TRAY) ×4 IMPLANT
PENCIL SMOKE EVACUATOR (MISCELLANEOUS) IMPLANT
SPONGE LAP 4X18 RFD (DISPOSABLE) ×12 IMPLANT
STRIP CLOSURE SKIN 1/2X4 (GAUZE/BANDAGES/DRESSINGS) ×3 IMPLANT
SUT MNCRL AB 4-0 PS2 18 (SUTURE) ×8 IMPLANT
SUT NOVA NAB GS-21 0 18 T12 DT (SUTURE) IMPLANT
SUT NOVA NAB GS-22 2 0 T19 (SUTURE) ×8 IMPLANT
SUT SILK 2 0 SH (SUTURE) ×4 IMPLANT
SUT VIC AB 3-0 SH 18 (SUTURE) ×4 IMPLANT
SYR 20ML LL LF (SYRINGE) ×4 IMPLANT
SYR BULB IRRIG 60ML STRL (SYRINGE) ×4 IMPLANT
SYR CONTROL 10ML LL (SYRINGE) ×4 IMPLANT
TOWEL OR 17X26 10 PK STRL BLUE (TOWEL DISPOSABLE) ×4 IMPLANT
TOWEL OR NON WOVEN STRL DISP B (DISPOSABLE) ×4 IMPLANT

## 2020-06-23 NOTE — Op Note (Signed)
Procedure Note  Pre-operative Diagnosis:  Reducible left inguinal hernia, reducible umbilical hernia  Post-operative Diagnosis: same  Procedure:  Open left inguinal hernia repair with mesh, open umbilical hernia repair with mesh patch  Surgeon:  Armandina Gemma, MD  Assistant:  Ahmed Prima, MD  Anesthesia:  General  Preparation:  Chlora-prep  Estimated Blood Loss: minimal  Complications:  none  Indications: The patient is referred by Dr. Belinda Fisher for surgical evaluation and management of left inguinal hernia. Patient states that he first noted the hernia approximately 2 years ago. It has gradually increased in size. It causes intermittent discomfort. It is always been fully reducible. During the day every, more prominent and the patient notes that he is supporting it with his left hand more often. He denies any change in his bowel habits. He has had no prior abdominal surgery. He has had no prior hernia repairs. Patient is employed in the [employer]. He presents today for repair.  Procedure Details  The patient was evaluated in the holding area. All of the patient's questions were answered and the proposed procedure was confirmed. The site of the procedure was properly marked. The patient was taken to the Operating Room, identified by name, and the procedure verified as inguinal hernia repair.  The patient was placed in the supine position and underwent induction of anesthesia. A "Time Out" was performed per routine. The lower abdomen and groin were prepped and draped in the usual aseptic fashion.  After ascertaining that an adequate level of anesthesia had been obtained, an incision was made in the groin with a #10 blade.  Dissection was carried through the subcutaneous tissues and hemostasis obtained with the electrocautery.  A Gelpi retractor was placed for exposure.  The external oblique fascia was incised in line with it's fibers and extended through the external  inguinal ring.  The cord structures were dissected out of the inguinal canal and encircled with a Penrose drain.  The floor of the inguinal canal was dissected out.  There was no evidence of a direct fascial defect.  The cord was explored and a large sliding type indirect hernia was identified.  The hernia sac was dissected away from the cord structures up to the level of the internal inguinal ring.  The sac was opened and contained a loop of sigmoid colon.  This was dissected free from the hernia sac and reduced back within the peritoneal cavity.  The mouth of the hernia sac was then closed with a 0 Novafil pursestring suture.  The hernia sac was excised and discarded.  The internal ring was closed laterally with interrupted 0 Novafil sutures.  The floor of the inguinal canal was reconstructed with Ethicon Ultrapro mesh cut to the appropriate dimensions.  It was secured to the pubic tubercle with a 2-0 Novafil suture and along the inguinal ligament with a running 2-0 Novafil suture.  Mesh was split to accommodate the cord structures.  The superior margin of the mesh was secured to the transversalis and internal oblique musculature with interrupted 2-0 Novafil sutures.  The tails of the mesh were overlapped lateral to the cord structures and secured to the inguinal ligament with interrupted 2-0 Novafil sutures to recreate the internal inguinal ring.  Cord structures were returned to the inguinal canal.  Local anesthetic was infiltrated throughout the field.  External oblique fascia was closed with interrupted 3-0 Vicryl sutures.  Subcutaneous tissues were closed with interrupted 3-0 Vicryl sutures.  Skin was anesthetized with local anesthetic,  and the skin edges were re-approximated with a running 4-0 Monocryl suture.  An incision was made beneath the umbilicus.  Dissection was carried through subcutaneous tissues to the fascia.  Hernia sac was dissected out away from the umbilical skin.  It was dissected out  circumferentially and reduced back within the peritoneal cavity.  Fascial edges were defined circumferentially.  Preperitoneal space was developed with blunt dissection.  A Bard Ventralex ST mesh patch 4.3 cm diameter was selected and prepared.  It was inserted into the preperitoneal space and deployed circumferentially.  Fascia was closed over the mesh incorporating the mesh into the closure with interrupted 0 Novafil sutures.  Umbilicus is reaffixed to the abdominal wall with an interrupted 0 Novafil suture.  Local anesthetic is infiltrated throughout the operative field.  Subcutaneous tissues are closed with interrupted 3-0 Vicryl sutures.  Skin is closed with a running 4-0 Monocryl subcuticular suture.  Wounds were washed and dried and Dermabond was applied.  Instrument, sponge, and needle counts were correct prior to closure and at the conclusion of the case.  The patient tolerated the procedure well.  The patient was awakened from anesthesia and brought to the recovery room in stable condition.  Armandina Gemma, MD [facility] Office: [phone]

## 2020-06-23 NOTE — Anesthesia Procedure Notes (Signed)
Procedure Name: Intubation Date/Time: 06/23/2020 7:31 AM Performed by: Eben Burow, CRNA Pre-anesthesia Checklist: Patient identified, Emergency Drugs available, Suction available, Patient being monitored and Timeout performed Patient Re-evaluated:Patient Re-evaluated prior to induction Oxygen Delivery Method: Circle system utilized Preoxygenation: Pre-oxygenation with 100% oxygen Induction Type: IV induction Ventilation: Mask ventilation without difficulty Laryngoscope Size: Mac and 4 Grade View: Grade I Tube type: Oral Tube size: 7.5 mm Number of attempts: 1 Airway Equipment and Method: Stylet Placement Confirmation: ETT inserted through vocal cords under direct vision,  positive ETCO2 and breath sounds checked- equal and bilateral Secured at: 23 cm Tube secured with: Tape Dental Injury: Teeth and Oropharynx as per pre-operative assessment

## 2020-06-23 NOTE — Transfer of Care (Signed)
Immediate Anesthesia Transfer of Care Note  Patient: Brad Stewart  Procedure(s) Performed: OPEN REPAIR LEFT INGUINAL HERNIA WITH MESH (Left ) OPEN REPAIR UMBILICAL HERNIA WITH MESH (N/A )  Patient Location: PACU  Anesthesia Type:General  Level of Consciousness: drowsy and patient cooperative  Airway & Oxygen Therapy: Patient Spontanous Breathing and Patient connected to face mask oxygen  Post-op Assessment: Report given to RN and Post -op Vital signs reviewed and stable  Post vital signs: Reviewed and stable  Last Vitals:  Vitals Value Taken Time  BP 145/100 06/23/20 0916  Temp 36.7 C 06/23/20 0915  Pulse 61 06/23/20 0917  Resp 16 06/23/20 0917  SpO2 97 % 06/23/20 0917  Vitals shown include unvalidated device data.  Last Pain:  Vitals:   06/23/20 0623  TempSrc:   PainSc: 0-No pain         Complications: No complications documented.

## 2020-06-23 NOTE — Discharge Instructions (Signed)
[facility]  HERNIA REPAIR POST OP INSTRUCTIONS  Always review your discharge instruction sheet given to you by the facility where your surgery was performed.  1. A  prescription for pain medication may be given to you upon discharge.  Take your pain medication as prescribed.  If narcotic pain medicine is not needed, then you may take acetaminophen (Tylenol) or ibuprofen (Advil) as needed.  2. Take your usually prescribed medications unless otherwise directed.  3. If you need a refill on your pain medication, please contact your pharmacy.  They will contact our office to request authorization. Prescriptions will not be filled after 5 pm daily or on weekends.  4. You should follow a light diet the first 24 hours after arrival home, such as soup and crackers or toast.  Be sure to include plenty of fluids daily.  Resume your normal diet the day after surgery.  5. Most patients will experience some swelling and bruising around the surgical site.  Ice packs and reclining will help.  Swelling and bruising can take several days to resolve.   6. It is common to experience some constipation if taking pain medication after surgery.  Increasing fluid intake and taking a stool softener (such as Colace) will usually help or prevent this problem from occurring.  A mild laxative (Milk of Magnesia or Miralax) should be taken according to package directions if there are no bowel movements after 48 hours.  7. You will likely have Dermabond (topical glue) over your incisions.  This seals the incisions and allows you to bathe and shower at any time after your surgery.  Glue should remain in place for up to 10 days.  It may be removed after 10 days by pealing off the Dermabond material or using Vaseline or naval jelly to remove.  8. If you have steri-strips over your incisions, you may remove the gauze bandage on the second day after surgery, and you may shower at that time.  Leave your steri-strips  (small skin tapes) in place directly over the incision.  These strips should remain on the skin for 5-7 days and then be removed.  You may get them wet in the shower and pat them dry.  9. ACTIVITIES:  You may resume regular (light) daily activities beginning the next day - such as daily self-care, walking, climbing stairs - gradually increasing activities as tolerated.  You may have sexual intercourse when it is comfortable.  Refrain from any heavy lifting or straining until approved by your doctor.  You may drive when you are no longer taking prescription pain medication, when you can comfortably wear a seatbelt, and when you can safely maneuver your car and apply brakes.  10. You should see your doctor in the office for a follow-up appointment approximately 2-3 weeks after your surgery.  Make sure that you call for this appointment within a day or two after you arrive home to insure a convenient appointment time.  WHEN TO CALL YOUR DOCTOR: 1. Fever greater than 101.0 2. Inability to urinate 3. Persistent nausea and/or vomiting 4. Extreme swelling or bruising 5. Continued bleeding from incision 6. Increased pain, redness, or drainage from the incision  The clinic staff is available to answer your questions during regular business hours.  Please don't hesitate to call and ask to speak to one of the nurses for clinical concerns.  If you have a medical emergency, go to the nearest emergency room or call 911.  A surgeon from [employer]  [facility] is always on call for the hospital.   [facility] [address], [place], [place], [place]  [address]  [phone] ? [phone] ? FAX [phone]  [email or web address]

## 2020-06-23 NOTE — Anesthesia Postprocedure Evaluation (Signed)
Anesthesia Post Note  Patient: Brad Stewart  Procedure(s) Performed: OPEN REPAIR LEFT INGUINAL HERNIA WITH MESH (Left ) OPEN REPAIR UMBILICAL HERNIA WITH MESH (N/A )     Patient location during evaluation: PACU Anesthesia Type: General Level of consciousness: sedated and patient cooperative Pain management: pain level controlled Vital Signs Assessment: post-procedure vital signs reviewed and stable Respiratory status: spontaneous breathing Cardiovascular status: stable Anesthetic complications: no   No complications documented.  Last Vitals:  Vitals:   06/23/20 0945 06/23/20 1000  BP: (!) 144/98 (!) 147/95  Pulse: 64 (!) 57  Resp: 16 16  Temp:  36.9 C  SpO2: 96% 95%    Last Pain:  Vitals:   06/23/20 1000  TempSrc:   PainSc: [place]

## 2020-06-23 NOTE — Interval H&P Note (Signed)
History and Physical Interval Note:  06/23/2020 7:04 AM  Brad Stewart  has presented today for surgery, with the diagnosis of LEFT INGUINAL HERNIA  UMBILICAL HERNIA.  The various methods of treatment have been discussed with the patient and family. After consideration of risks, benefits and other options for treatment, the patient has consented to    Procedure(s): OPEN REPAIR LEFT INGUINAL HERNIA WITH MESH (Left) OPEN REPAIR UMBILICAL HERNIA WITH MESH (N/A) as a surgical intervention.    The patient's history has been reviewed, patient examined, no change in status, stable for surgery.  I have reviewed the patient's chart and labs.  Questions were answered to the patient's satisfaction.    Armandina Gemma, MD [facility] Office: [facility]

## 2020-06-26 ENCOUNTER — Encounter (HOSPITAL_COMMUNITY): Payer: Self-pay | Admitting: Surgery

## 2020-06-27 ENCOUNTER — Encounter (HOSPITAL_COMMUNITY): Payer: Self-pay | Admitting: Surgery

## 2020-08-03 ENCOUNTER — Other Ambulatory Visit: Payer: Self-pay | Admitting: Internal Medicine

## 2020-08-22 DIAGNOSIS — Z20822 Contact with and (suspected) exposure to covid-19: Secondary | ICD-10-CM | POA: Diagnosis not present

## 2020-09-11 DIAGNOSIS — M7581 Other shoulder lesions, right shoulder: Secondary | ICD-10-CM | POA: Diagnosis not present

## 2020-09-21 DIAGNOSIS — M25511 Pain in right shoulder: Secondary | ICD-10-CM | POA: Diagnosis not present

## 2020-10-25 DIAGNOSIS — M7541 Impingement syndrome of right shoulder: Secondary | ICD-10-CM | POA: Diagnosis not present

## 2020-10-25 DIAGNOSIS — G8918 Other acute postprocedural pain: Secondary | ICD-10-CM | POA: Diagnosis not present

## 2020-10-25 DIAGNOSIS — M75111 Incomplete rotator cuff tear or rupture of right shoulder, not specified as traumatic: Secondary | ICD-10-CM | POA: Diagnosis not present

## 2020-10-25 DIAGNOSIS — M7521 Bicipital tendinitis, right shoulder: Secondary | ICD-10-CM | POA: Diagnosis not present

## 2020-10-25 DIAGNOSIS — M7551 Bursitis of right shoulder: Secondary | ICD-10-CM | POA: Diagnosis not present

## 2020-10-25 DIAGNOSIS — M66821 Spontaneous rupture of other tendons, right upper arm: Secondary | ICD-10-CM | POA: Diagnosis not present

## 2020-10-25 DIAGNOSIS — M24111 Other articular cartilage disorders, right shoulder: Secondary | ICD-10-CM | POA: Diagnosis not present

## 2020-10-25 DIAGNOSIS — M19011 Primary osteoarthritis, right shoulder: Secondary | ICD-10-CM | POA: Diagnosis not present

## 2020-11-01 ENCOUNTER — Other Ambulatory Visit: Payer: Self-pay | Admitting: Internal Medicine

## 2020-11-08 DIAGNOSIS — M25611 Stiffness of right shoulder, not elsewhere classified: Secondary | ICD-10-CM | POA: Diagnosis not present

## 2020-11-08 DIAGNOSIS — M7541 Impingement syndrome of right shoulder: Secondary | ICD-10-CM | POA: Diagnosis not present

## 2020-11-08 DIAGNOSIS — S43431D Superior glenoid labrum lesion of right shoulder, subsequent encounter: Secondary | ICD-10-CM | POA: Diagnosis not present

## 2020-11-08 DIAGNOSIS — M6281 Muscle weakness (generalized): Secondary | ICD-10-CM | POA: Diagnosis not present

## 2020-11-15 DIAGNOSIS — M25611 Stiffness of right shoulder, not elsewhere classified: Secondary | ICD-10-CM | POA: Diagnosis not present

## 2020-11-15 DIAGNOSIS — S43431D Superior glenoid labrum lesion of right shoulder, subsequent encounter: Secondary | ICD-10-CM | POA: Diagnosis not present

## 2020-11-15 DIAGNOSIS — M7541 Impingement syndrome of right shoulder: Secondary | ICD-10-CM | POA: Diagnosis not present

## 2020-11-15 DIAGNOSIS — M6281 Muscle weakness (generalized): Secondary | ICD-10-CM | POA: Diagnosis not present

## 2020-11-16 ENCOUNTER — Encounter: Payer: Self-pay | Admitting: Internal Medicine

## 2020-11-20 DIAGNOSIS — S43431D Superior glenoid labrum lesion of right shoulder, subsequent encounter: Secondary | ICD-10-CM | POA: Diagnosis not present

## 2020-11-20 DIAGNOSIS — M25611 Stiffness of right shoulder, not elsewhere classified: Secondary | ICD-10-CM | POA: Diagnosis not present

## 2020-11-20 DIAGNOSIS — M6281 Muscle weakness (generalized): Secondary | ICD-10-CM | POA: Diagnosis not present

## 2020-11-20 DIAGNOSIS — M7541 Impingement syndrome of right shoulder: Secondary | ICD-10-CM | POA: Diagnosis not present

## 2020-11-22 DIAGNOSIS — M7541 Impingement syndrome of right shoulder: Secondary | ICD-10-CM | POA: Diagnosis not present

## 2020-11-22 DIAGNOSIS — S43431D Superior glenoid labrum lesion of right shoulder, subsequent encounter: Secondary | ICD-10-CM | POA: Diagnosis not present

## 2020-11-22 DIAGNOSIS — M25611 Stiffness of right shoulder, not elsewhere classified: Secondary | ICD-10-CM | POA: Diagnosis not present

## 2020-11-22 DIAGNOSIS — M6281 Muscle weakness (generalized): Secondary | ICD-10-CM | POA: Diagnosis not present

## 2020-11-23 ENCOUNTER — Other Ambulatory Visit: Payer: Self-pay | Admitting: Internal Medicine

## 2020-11-27 ENCOUNTER — Other Ambulatory Visit: Payer: Self-pay | Admitting: Internal Medicine

## 2020-11-27 DIAGNOSIS — M25611 Stiffness of right shoulder, not elsewhere classified: Secondary | ICD-10-CM | POA: Diagnosis not present

## 2020-11-27 DIAGNOSIS — M7541 Impingement syndrome of right shoulder: Secondary | ICD-10-CM | POA: Diagnosis not present

## 2020-11-27 DIAGNOSIS — S43431D Superior glenoid labrum lesion of right shoulder, subsequent encounter: Secondary | ICD-10-CM | POA: Diagnosis not present

## 2020-11-27 DIAGNOSIS — M6281 Muscle weakness (generalized): Secondary | ICD-10-CM | POA: Diagnosis not present

## 2020-11-29 DIAGNOSIS — M6281 Muscle weakness (generalized): Secondary | ICD-10-CM | POA: Diagnosis not present

## 2020-11-29 DIAGNOSIS — M7541 Impingement syndrome of right shoulder: Secondary | ICD-10-CM | POA: Diagnosis not present

## 2020-11-29 DIAGNOSIS — S43431D Superior glenoid labrum lesion of right shoulder, subsequent encounter: Secondary | ICD-10-CM | POA: Diagnosis not present

## 2020-11-29 DIAGNOSIS — M25611 Stiffness of right shoulder, not elsewhere classified: Secondary | ICD-10-CM | POA: Diagnosis not present

## 2020-12-07 DIAGNOSIS — M25611 Stiffness of right shoulder, not elsewhere classified: Secondary | ICD-10-CM | POA: Diagnosis not present

## 2020-12-07 DIAGNOSIS — S43431D Superior glenoid labrum lesion of right shoulder, subsequent encounter: Secondary | ICD-10-CM | POA: Diagnosis not present

## 2020-12-07 DIAGNOSIS — M7541 Impingement syndrome of right shoulder: Secondary | ICD-10-CM | POA: Diagnosis not present

## 2020-12-07 DIAGNOSIS — M6281 Muscle weakness (generalized): Secondary | ICD-10-CM | POA: Diagnosis not present

## 2020-12-11 DIAGNOSIS — M6281 Muscle weakness (generalized): Secondary | ICD-10-CM | POA: Diagnosis not present

## 2020-12-11 DIAGNOSIS — M7541 Impingement syndrome of right shoulder: Secondary | ICD-10-CM | POA: Diagnosis not present

## 2020-12-11 DIAGNOSIS — S43431D Superior glenoid labrum lesion of right shoulder, subsequent encounter: Secondary | ICD-10-CM | POA: Diagnosis not present

## 2020-12-11 DIAGNOSIS — M25611 Stiffness of right shoulder, not elsewhere classified: Secondary | ICD-10-CM | POA: Diagnosis not present

## 2020-12-13 ENCOUNTER — Encounter: Payer: Self-pay | Admitting: Internal Medicine

## 2020-12-14 ENCOUNTER — Telehealth: Payer: Self-pay | Admitting: Internal Medicine

## 2020-12-14 ENCOUNTER — Other Ambulatory Visit: Payer: Self-pay

## 2020-12-14 DIAGNOSIS — L814 Other melanin hyperpigmentation: Secondary | ICD-10-CM | POA: Diagnosis not present

## 2020-12-14 DIAGNOSIS — D2262 Melanocytic nevi of left upper limb, including shoulder: Secondary | ICD-10-CM | POA: Diagnosis not present

## 2020-12-14 DIAGNOSIS — D2261 Melanocytic nevi of right upper limb, including shoulder: Secondary | ICD-10-CM | POA: Diagnosis not present

## 2020-12-14 DIAGNOSIS — D485 Neoplasm of uncertain behavior of skin: Secondary | ICD-10-CM | POA: Diagnosis not present

## 2020-12-14 DIAGNOSIS — M7541 Impingement syndrome of right shoulder: Secondary | ICD-10-CM | POA: Diagnosis not present

## 2020-12-14 DIAGNOSIS — S43431D Superior glenoid labrum lesion of right shoulder, subsequent encounter: Secondary | ICD-10-CM | POA: Diagnosis not present

## 2020-12-14 DIAGNOSIS — D225 Melanocytic nevi of trunk: Secondary | ICD-10-CM | POA: Diagnosis not present

## 2020-12-14 DIAGNOSIS — M25611 Stiffness of right shoulder, not elsewhere classified: Secondary | ICD-10-CM | POA: Diagnosis not present

## 2020-12-14 DIAGNOSIS — M6281 Muscle weakness (generalized): Secondary | ICD-10-CM | POA: Diagnosis not present

## 2020-12-14 MED ORDER — LEVOTHYROXINE SODIUM 150 MCG PO TABS
150.0000 ug | ORAL_TABLET | Freq: Every day | ORAL | 0 refills | Status: DC
Start: 2020-12-14 — End: 2021-01-09

## 2020-12-14 NOTE — Telephone Encounter (Signed)
noted 

## 2020-12-14 NOTE — Telephone Encounter (Signed)
Patient states he will call us back to schedule appt

## 2020-12-14 NOTE — Telephone Encounter (Signed)
-----   Message from Colon Branch, MD sent at 12/14/2020  9:17 AM EDT ----- Brad Stewart, send 30-day supply of Synthroid Mitzo: Call patient, schedule CPX within 30

## 2020-12-22 DIAGNOSIS — M6281 Muscle weakness (generalized): Secondary | ICD-10-CM | POA: Diagnosis not present

## 2020-12-22 DIAGNOSIS — M25611 Stiffness of right shoulder, not elsewhere classified: Secondary | ICD-10-CM | POA: Diagnosis not present

## 2020-12-22 DIAGNOSIS — M7541 Impingement syndrome of right shoulder: Secondary | ICD-10-CM | POA: Diagnosis not present

## 2020-12-22 DIAGNOSIS — S43431D Superior glenoid labrum lesion of right shoulder, subsequent encounter: Secondary | ICD-10-CM | POA: Diagnosis not present

## 2020-12-25 DIAGNOSIS — M6281 Muscle weakness (generalized): Secondary | ICD-10-CM | POA: Diagnosis not present

## 2020-12-25 DIAGNOSIS — S43431D Superior glenoid labrum lesion of right shoulder, subsequent encounter: Secondary | ICD-10-CM | POA: Diagnosis not present

## 2020-12-25 DIAGNOSIS — M7541 Impingement syndrome of right shoulder: Secondary | ICD-10-CM | POA: Diagnosis not present

## 2020-12-25 DIAGNOSIS — M25611 Stiffness of right shoulder, not elsewhere classified: Secondary | ICD-10-CM | POA: Diagnosis not present

## 2020-12-27 DIAGNOSIS — M7541 Impingement syndrome of right shoulder: Secondary | ICD-10-CM | POA: Diagnosis not present

## 2020-12-27 DIAGNOSIS — M25611 Stiffness of right shoulder, not elsewhere classified: Secondary | ICD-10-CM | POA: Diagnosis not present

## 2020-12-27 DIAGNOSIS — S43431D Superior glenoid labrum lesion of right shoulder, subsequent encounter: Secondary | ICD-10-CM | POA: Diagnosis not present

## 2020-12-27 DIAGNOSIS — M6281 Muscle weakness (generalized): Secondary | ICD-10-CM | POA: Diagnosis not present

## 2021-01-05 ENCOUNTER — Other Ambulatory Visit: Payer: Self-pay | Admitting: Internal Medicine

## 2021-01-05 DIAGNOSIS — M25611 Stiffness of right shoulder, not elsewhere classified: Secondary | ICD-10-CM | POA: Diagnosis not present

## 2021-01-05 DIAGNOSIS — M7541 Impingement syndrome of right shoulder: Secondary | ICD-10-CM | POA: Diagnosis not present

## 2021-01-05 DIAGNOSIS — M6281 Muscle weakness (generalized): Secondary | ICD-10-CM | POA: Diagnosis not present

## 2021-01-05 DIAGNOSIS — S43431D Superior glenoid labrum lesion of right shoulder, subsequent encounter: Secondary | ICD-10-CM | POA: Diagnosis not present

## 2021-01-09 ENCOUNTER — Other Ambulatory Visit: Payer: Self-pay | Admitting: Internal Medicine

## 2021-01-12 DIAGNOSIS — M7541 Impingement syndrome of right shoulder: Secondary | ICD-10-CM | POA: Diagnosis not present

## 2021-01-12 DIAGNOSIS — M25611 Stiffness of right shoulder, not elsewhere classified: Secondary | ICD-10-CM | POA: Diagnosis not present

## 2021-01-12 DIAGNOSIS — S43431D Superior glenoid labrum lesion of right shoulder, subsequent encounter: Secondary | ICD-10-CM | POA: Diagnosis not present

## 2021-01-12 DIAGNOSIS — M6281 Muscle weakness (generalized): Secondary | ICD-10-CM | POA: Diagnosis not present

## 2021-01-15 DIAGNOSIS — M7541 Impingement syndrome of right shoulder: Secondary | ICD-10-CM | POA: Diagnosis not present

## 2021-01-15 DIAGNOSIS — S43431D Superior glenoid labrum lesion of right shoulder, subsequent encounter: Secondary | ICD-10-CM | POA: Diagnosis not present

## 2021-01-15 DIAGNOSIS — M6281 Muscle weakness (generalized): Secondary | ICD-10-CM | POA: Diagnosis not present

## 2021-01-15 DIAGNOSIS — M25611 Stiffness of right shoulder, not elsewhere classified: Secondary | ICD-10-CM | POA: Diagnosis not present

## 2021-01-17 DIAGNOSIS — M7541 Impingement syndrome of right shoulder: Secondary | ICD-10-CM | POA: Diagnosis not present

## 2021-01-17 DIAGNOSIS — M6281 Muscle weakness (generalized): Secondary | ICD-10-CM | POA: Diagnosis not present

## 2021-01-17 DIAGNOSIS — S43431D Superior glenoid labrum lesion of right shoulder, subsequent encounter: Secondary | ICD-10-CM | POA: Diagnosis not present

## 2021-01-17 DIAGNOSIS — M25611 Stiffness of right shoulder, not elsewhere classified: Secondary | ICD-10-CM | POA: Diagnosis not present

## 2021-01-18 DIAGNOSIS — M7541 Impingement syndrome of right shoulder: Secondary | ICD-10-CM | POA: Diagnosis not present

## 2021-01-23 ENCOUNTER — Ambulatory Visit (INDEPENDENT_AMBULATORY_CARE_PROVIDER_SITE_OTHER): Payer: BC Managed Care – PPO | Admitting: Internal Medicine

## 2021-01-23 ENCOUNTER — Other Ambulatory Visit: Payer: Self-pay

## 2021-01-23 ENCOUNTER — Encounter: Payer: Self-pay | Admitting: Internal Medicine

## 2021-01-23 VITALS — BP 126/80 | HR 55 | Temp 98.2°F | Resp 16 | Ht 73.0 in | Wt 229.1 lb

## 2021-01-23 DIAGNOSIS — Z125 Encounter for screening for malignant neoplasm of prostate: Secondary | ICD-10-CM | POA: Diagnosis not present

## 2021-01-23 DIAGNOSIS — R739 Hyperglycemia, unspecified: Secondary | ICD-10-CM

## 2021-01-23 DIAGNOSIS — Z0000 Encounter for general adult medical examination without abnormal findings: Secondary | ICD-10-CM | POA: Diagnosis not present

## 2021-01-23 DIAGNOSIS — Z8639 Personal history of other endocrine, nutritional and metabolic disease: Secondary | ICD-10-CM | POA: Diagnosis not present

## 2021-01-23 DIAGNOSIS — E039 Hypothyroidism, unspecified: Secondary | ICD-10-CM | POA: Diagnosis not present

## 2021-01-23 NOTE — Patient Instructions (Signed)
    GO TO THE LAB : Get the blood work     [place], [place] Come back for physical exam in 1 year

## 2021-01-23 NOTE — Progress Notes (Signed)
Subjective:    Patient ID: Brad Stewart, male    DOB: 15-Oct-1960, 60 y.o.   MRN: [id number]  DOS:  01/23/2021 Type of visit - description: CPX  Last visit was approximately 1.5 years ago. In the last few months he had a couple of surgeries and the recovery has been slow. He is currently asymptomatic.    Review of Systems  Other than above, a 14 point review of systems is negative      Past Medical History:  Diagnosis Date  . COVID-19 09/2019  . History of kidney stones   . Hypothyroidism     Past Surgical History:  Procedure Laterality Date  . INGUINAL HERNIA REPAIR Left 06/23/2020   Procedure: OPEN REPAIR LEFT INGUINAL HERNIA WITH MESH;  Surgeon: Armandina Gemma, MD;  Location: [facility];  Service: General;  Laterality: Left;  . SHOULDER ARTHROSCOPY Left   . UMBILICAL HERNIA REPAIR N/A 06/23/2020   Procedure: OPEN REPAIR UMBILICAL HERNIA WITH MESH;  Surgeon: Armandina Gemma, MD;  Location: [facility];  Service: General;  Laterality: N/A;   Social History   Socioeconomic History  . Marital status: Divorced    Spouse name: Not on file  . Number of children: 2  . Years of education: Not on file  . Highest education level: Not on file  Occupational History  . Occupation: Customer service manager   Tobacco Use  . Smoking status: Never Smoker  . Smokeless tobacco: Never Used  Vaping Use  . Vaping Use: Never used  Substance and Sexual Activity  . Alcohol use: Yes    Alcohol/week: 0.0 standard drinks    Comment: socially   . Drug use: No  . Sexual activity: Not Currently  Other Topics Concern  . Not on file  Social History Narrative   Household: self, divorced     2 adult children   Social Determinants of Health   Financial Resource Strain: Not on file  Food Insecurity: Not on file  Transportation Needs: Not on file  Physical Activity: Not on file  Stress: Not on file  Social Connections: Not on file  Intimate Partner Violence: Not on file    Allergies as of 01/23/2021       Reactions   Sulfonamide Derivatives Nausea And Vomiting      Medication List       Accurate as of Jan 23, 2021 11:59 PM. If you have any questions, ask your nurse or doctor.        STOP taking these medications   meloxicam 7.5 MG tablet Commonly known as: MOBIC Stopped by: Kathlene November, MD   multivitamin with minerals Tabs tablet Stopped by: Kathlene November, MD   naproxen sodium 220 MG tablet Commonly known as: ALEVE Stopped by: Kathlene November, MD   tamsulosin 0.4 MG Caps capsule Commonly known as: Flomax Stopped by: Kathlene November, MD   traMADol 50 MG tablet Commonly known as: Market researcher Stopped by: Kathlene November, MD     TAKE these medications   cholecalciferol 25 MCG (1000 UNIT) tablet Commonly known as: VITAMIN D3 Take 1,000 Units by mouth daily.   levothyroxine 150 MCG tablet Commonly known as: SYNTHROID Take 1 tablet (150 mcg total) by mouth daily before breakfast. Must keep appt for 01/23/21 for further refills          Objective:   Physical Exam BP 126/80 (BP Location: Left Arm, Patient Position: Sitting, Cuff Size: Normal)   Pulse (!) 55   Temp 98.2 F (36.8 C) (Oral)  Resp 16   Ht 6\' 1"  (1.854 m)   Wt 229 lb 2 oz (103.9 kg)   SpO2 97%   BMI 30.23 kg/m  General: Well developed, NAD, BMI noted Neck: No  thyromegaly  HEENT:  Normocephalic . Face symmetric, atraumatic Lungs:  CTA B Normal respiratory effort, no intercostal retractions, no accessory muscle use. Heart: RRR,  no murmur.  Abdomen:  Not distended, soft, non-tender. No rebound or rigidity.   Lower extremities: no pretibial edema bilaterally  Skin: Exposed areas without rash. Not pale. Not jaundice Neurologic:  alert & oriented X3.  Speech normal, gait appropriate for age and unassisted Strength symmetric and appropriate for age.  Psych: Cognition and judgment appear intact.  Cooperative with normal attention span and concentration.  Behavior appropriate. No anxious or depressed appearing.      Assessment      Assessment   Prediabetes- 10-2014 ---> A1c  6.1 Hypothyroidism Vitamin D deficiency Cold sores  Kidney stones 03-2020 Covid infex 09/2019 Skin bx, sees dermatology  PLAN: Here for CPX Prediabetes: Check A1c Hypothyroidism: Reports good med compliance, check a TSH.  He is aware we might need to check his blood levels a few times throughout the year. Vitamin D deficiency: On OTCs, checking levels. RTC 1 year   This visit occurred during the SARS-CoV-2 public health emergency.  Safety protocols were in place, including screening questions prior to the visit, additional usage of staff PPE, and extensive cleaning of exam room while observing appropriate contact time as indicated for disinfecting solutions.

## 2021-01-24 LAB — LIPID PANEL
Cholesterol: 196 mg/dL (ref 0–200)
HDL: 37.7 mg/dL — ABNORMAL LOW (ref >39.00)
LDL Cholesterol: 135 mg/dL — ABNORMAL HIGH (ref 0–99)
NonHDL: 158.54
Total CHOL/HDL Ratio: 5
Triglycerides: 118 mg/dL (ref 0.0–149.0)
VLDL: 23.6 mg/dL (ref 0.0–40.0)

## 2021-01-24 LAB — COMPREHENSIVE METABOLIC PANEL
ALT: 24 U/L (ref 0–53)
AST: 26 U/L (ref 0–37)
Albumin: 4.2 g/dL (ref 3.5–5.2)
Alkaline Phosphatase: 63 U/L (ref 39–117)
BUN: 18 mg/dL (ref 6–23)
CO2: 28 mEq/L (ref 19–32)
Calcium: 9.2 mg/dL (ref 8.4–10.5)
Chloride: 105 mEq/L (ref 96–112)
Creatinine, Ser: 1.08 mg/dL (ref 0.40–1.50)
GFR: 74.87 mL/min (ref >60.00)
Glucose, Bld: 112 mg/dL — ABNORMAL HIGH (ref 70–99)
Potassium: 4.8 mEq/L (ref 3.5–5.1)
Sodium: 140 mEq/L (ref 135–145)
Total Bilirubin: 0.7 mg/dL (ref 0.2–1.2)
Total Protein: 6.9 g/dL (ref 6.0–8.3)

## 2021-01-24 LAB — CBC WITH DIFFERENTIAL/PLATELET
Basophils Absolute: 0 10*3/uL (ref 0.0–0.1)
Basophils Relative: 0.9 % (ref 0.0–3.0)
Eosinophils Absolute: 0 10*3/uL (ref 0.0–0.7)
Eosinophils Relative: 0.6 % (ref 0.0–5.0)
HCT: 38.9 % — ABNORMAL LOW (ref 39.0–52.0)
Hemoglobin: 13.2 g/dL (ref 13.0–17.0)
Lymphocytes Relative: 34.6 % (ref 12.0–46.0)
Lymphs Abs: 1.8 10*3/uL (ref 0.7–4.0)
MCHC: 34 g/dL (ref 30.0–36.0)
MCV: 87 fl (ref 78.0–100.0)
Monocytes Absolute: 0.5 10*3/uL (ref 0.1–1.0)
Monocytes Relative: 9.4 % (ref 3.0–12.0)
Neutro Abs: 2.9 10*3/uL (ref 1.4–7.7)
Neutrophils Relative %: 54.5 % (ref 43.0–77.0)
Platelets: 305 10*3/uL (ref 150.0–400.0)
RBC: 4.48 Mil/uL (ref 4.22–5.81)
RDW: 13.4 % (ref 11.5–15.5)
WBC: 5.3 10*3/uL (ref 4.0–10.5)

## 2021-01-24 LAB — PSA: PSA: 0.38 ng/mL (ref 0.10–4.00)

## 2021-01-24 LAB — HEMOGLOBIN A1C: Hgb A1c MFr Bld: 6.2 % (ref 4.6–6.5)

## 2021-01-24 LAB — TSH: TSH: 9.64 u[IU]/mL — ABNORMAL HIGH (ref 0.35–4.50)

## 2021-01-24 LAB — VITAMIN D 25 HYDROXY (VIT D DEFICIENCY, FRACTURES): VITD: 26.82 ng/mL — ABNORMAL LOW (ref 30.00–100.00)

## 2021-01-24 NOTE — Assessment & Plan Note (Signed)
Here for CPX Prediabetes: Check A1c Hypothyroidism: Reports good med compliance, check a TSH.  He is aware we might need to check his blood levels a few times throughout the year. Vitamin D deficiency: On OTCs, checking levels. RTC 1 year

## 2021-01-24 NOTE — Assessment & Plan Note (Signed)
Tdap 2018 Declined a flu shot S/p Shingrix x2 Covid vaccines: He had a COVID infection 09-2019, has not been vaccinated, I offered my opinion about vaccines and he told me he is not interested.  I made him aware that even if he had infection before, he could get COVID again and it could be potentially serious or deadly.  He verbalized understanding.  Prostate cancer screening: 08/2019: DRE was normal, PSA was 0.4.  No symptoms, check a PSA CCS: Colonoscopy 10/22/2019, next 2028 per GI letter. Labs: CMP, FLP, CBC, A1c, TSH, PSA, vitamin D.  (Not fasting, had a very small sandwich). Diet is ok. Exercise: active, will start to do more after recovering from shoulder and hernia surgery

## 2021-01-31 ENCOUNTER — Other Ambulatory Visit: Payer: Self-pay

## 2021-01-31 DIAGNOSIS — E039 Hypothyroidism, unspecified: Secondary | ICD-10-CM

## 2021-01-31 MED ORDER — LEVOTHYROXINE SODIUM 175 MCG PO TABS: 175.0000 ug | ORAL_TABLET | Freq: Every day | ORAL | 0 refills | Status: DC

## 2021-01-31 MED ORDER — VITAMIN D (ERGOCALCIFEROL) 1.25 MG (50000 UNIT) PO CAPS: 50000.0000 [IU] | ORAL_CAPSULE | ORAL | 0 refills | Status: DC

## 2021-04-20 ENCOUNTER — Other Ambulatory Visit (INDEPENDENT_AMBULATORY_CARE_PROVIDER_SITE_OTHER): Payer: BC Managed Care – PPO

## 2021-04-20 ENCOUNTER — Other Ambulatory Visit: Payer: Self-pay

## 2021-04-20 DIAGNOSIS — E039 Hypothyroidism, unspecified: Secondary | ICD-10-CM

## 2021-04-20 LAB — TSH: TSH: 7.14 u[IU]/mL — ABNORMAL HIGH (ref 0.35–5.50)

## 2021-04-22 ENCOUNTER — Other Ambulatory Visit: Payer: Self-pay | Admitting: Internal Medicine

## 2021-04-23 MED ORDER — LEVOTHYROXINE SODIUM 100 MCG PO TABS: 200.0000 ug | ORAL_TABLET | Freq: Every day | ORAL | 0 refills | Status: DC

## 2021-04-23 NOTE — Addendum Note (Signed)
Addended byDamita Dunnings D on: 04/23/2021 09:13 AM   Modules accepted: Orders

## 2021-06-19 ENCOUNTER — Other Ambulatory Visit: Payer: BC Managed Care – PPO

## 2021-06-20 ENCOUNTER — Encounter: Payer: Self-pay | Admitting: Internal Medicine

## 2021-06-20 ENCOUNTER — Other Ambulatory Visit: Payer: Self-pay

## 2021-06-20 ENCOUNTER — Other Ambulatory Visit (INDEPENDENT_AMBULATORY_CARE_PROVIDER_SITE_OTHER): Payer: BC Managed Care – PPO

## 2021-06-20 DIAGNOSIS — Z8639 Personal history of other endocrine, nutritional and metabolic disease: Secondary | ICD-10-CM

## 2021-06-20 DIAGNOSIS — E039 Hypothyroidism, unspecified: Secondary | ICD-10-CM

## 2021-06-20 NOTE — Telephone Encounter (Signed)
FYI- added vit D- he has a hx of vit D deficiency.

## 2021-06-21 LAB — VITAMIN D 25 HYDROXY (VIT D DEFICIENCY, FRACTURES): VITD: 30.84 ng/mL (ref 30.00–100.00)

## 2021-06-21 LAB — TSH: TSH: 1.18 u[IU]/mL (ref 0.35–5.50)

## 2021-06-25 MED ORDER — LEVOTHYROXINE SODIUM 100 MCG PO TABS: 200.0000 ug | ORAL_TABLET | Freq: Every day | ORAL | 1 refills | Status: DC

## 2021-06-25 NOTE — Addendum Note (Signed)
Addended by: Natisha Trzcinski D on: 06/25/2021 08:05 AM   Modules accepted: Orders  

## 2021-09-25 IMAGING — CT CT RENAL STONE PROTOCOL
2 of 4 series · 15 of 46 positions shown, 17 images · non-contrast
Comparison: None.

CLINICAL DATA: Microhematuria, intermittent left flank pain since
yesterday

EXAM:
CT ABDOMEN AND PELVIS WITHOUT CONTRAST
TECHNIQUE: Multidetector CT imaging of the abdomen and pelvis was performed
following the standard protocol without IV contrast.

[Series 2: axial st · axial · 0.87mm/px · z∈[+595,+1135]mm · 12 of 118 slices shown, 14 images]
[im 5/118  soft-tissue]
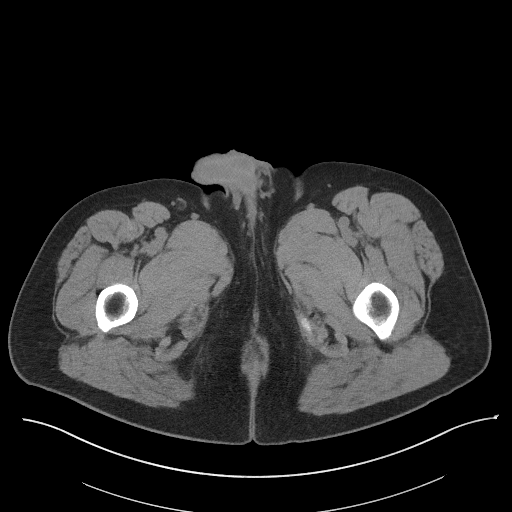
[im 5/118  bone]
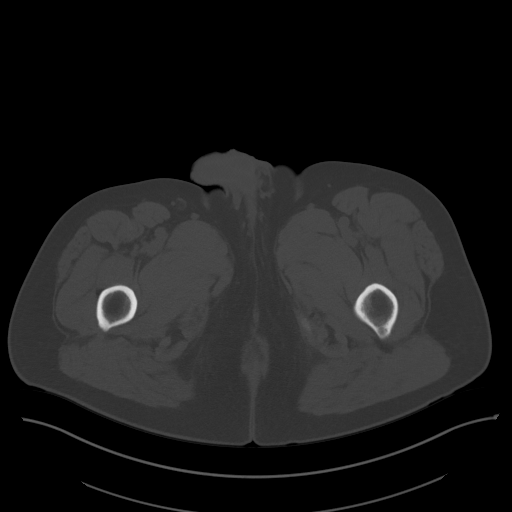
[im 15/118  soft-tissue]
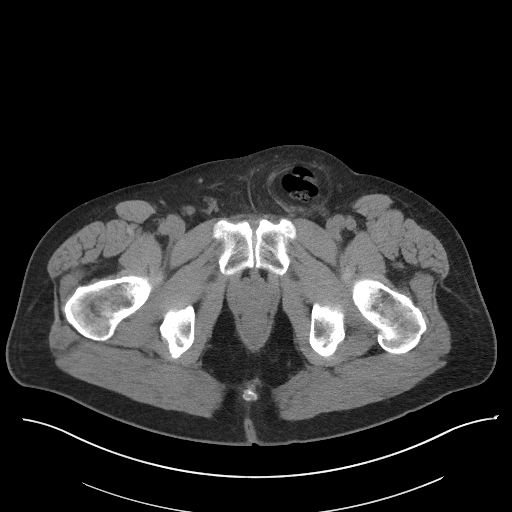
[im 25/118  soft-tissue]
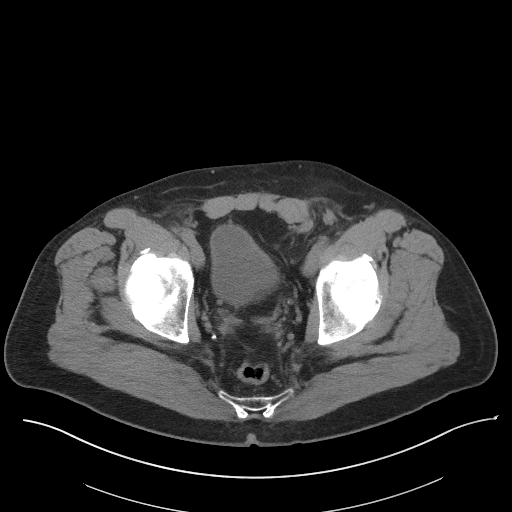
[im 35/118  soft-tissue]
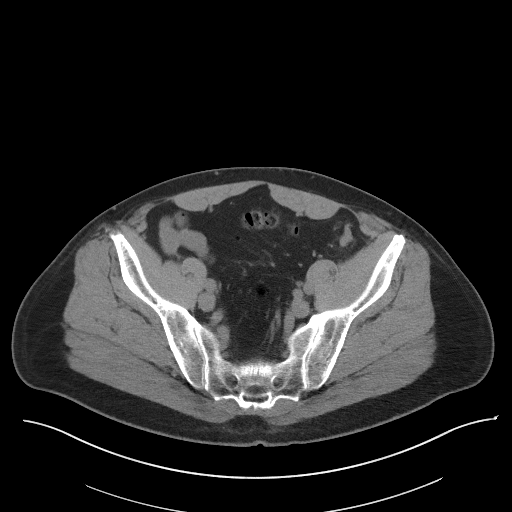
[im 44/118  soft-tissue]
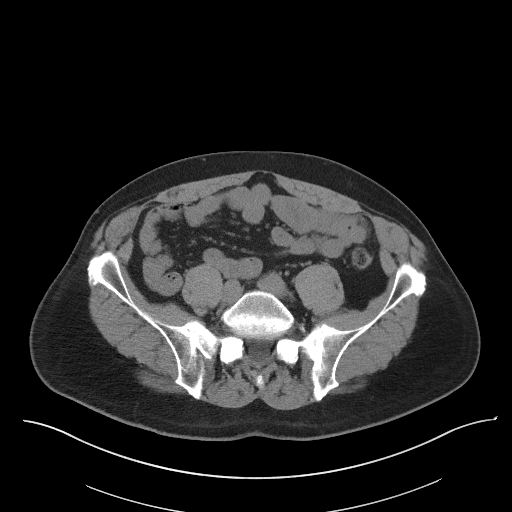
[im 54/118  soft-tissue]
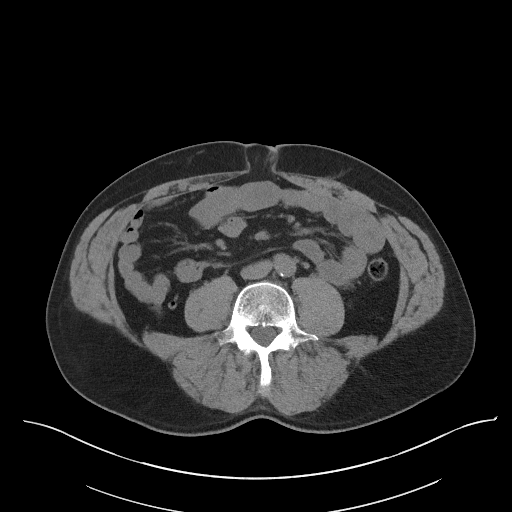
[im 64/118  soft-tissue]
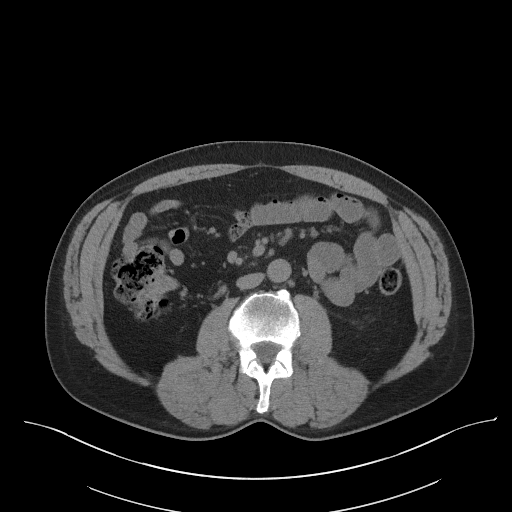
[im 74/118  soft-tissue]
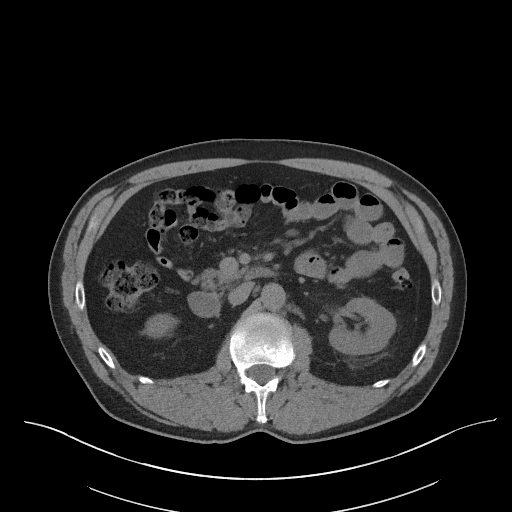
[im 83/118  soft-tissue]
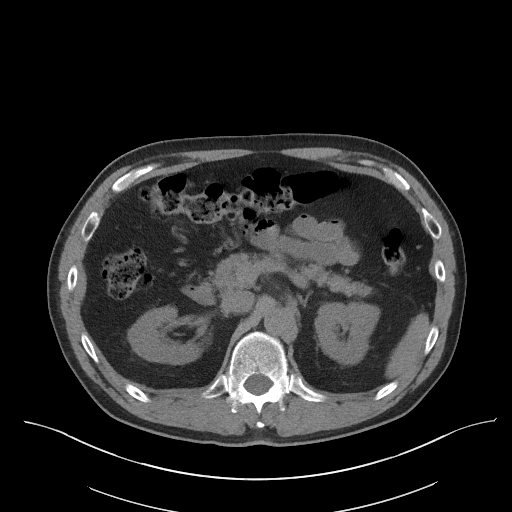
[im 83/118  bone]
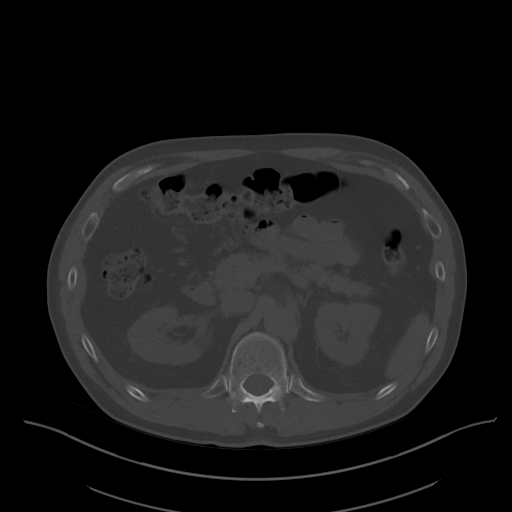
[im 93/118  soft-tissue]
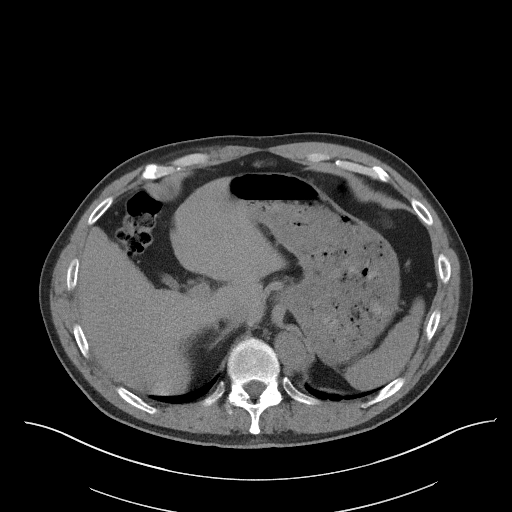
[im 103/118  soft-tissue]
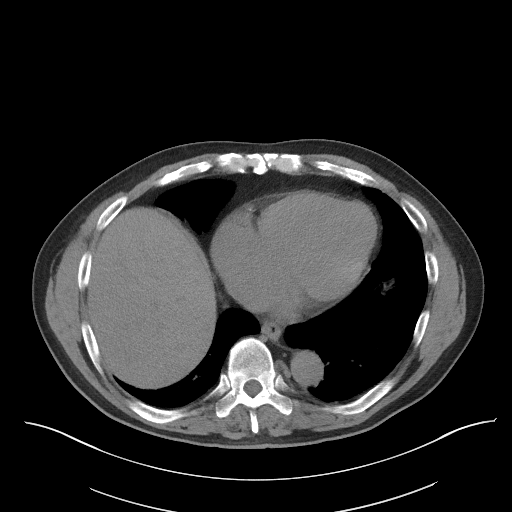
[im 113/118  soft-tissue]
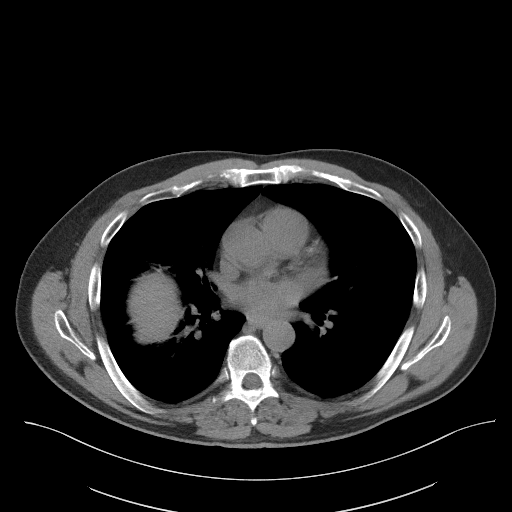

[Series 5: coronal st · coronal · 0.75mm/px · 3 of 101 slices shown]
[im 34/101  soft-tissue]
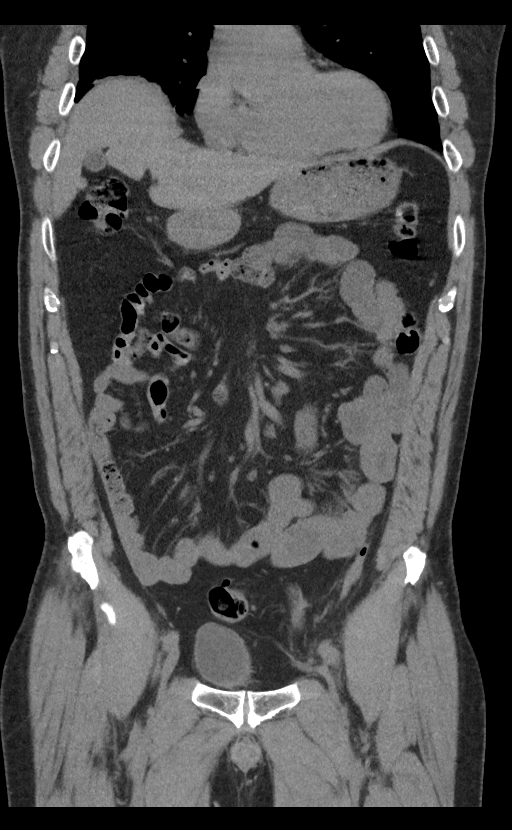
[im 45/101  soft-tissue]
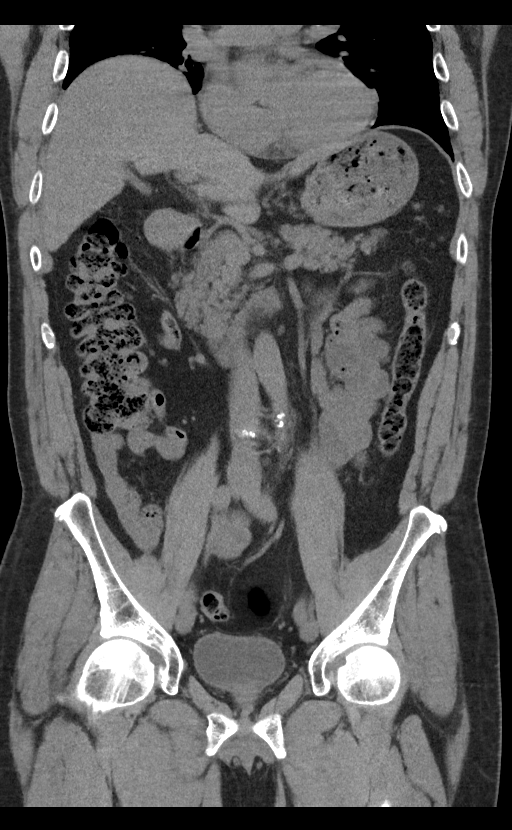
[im 56/101  soft-tissue]
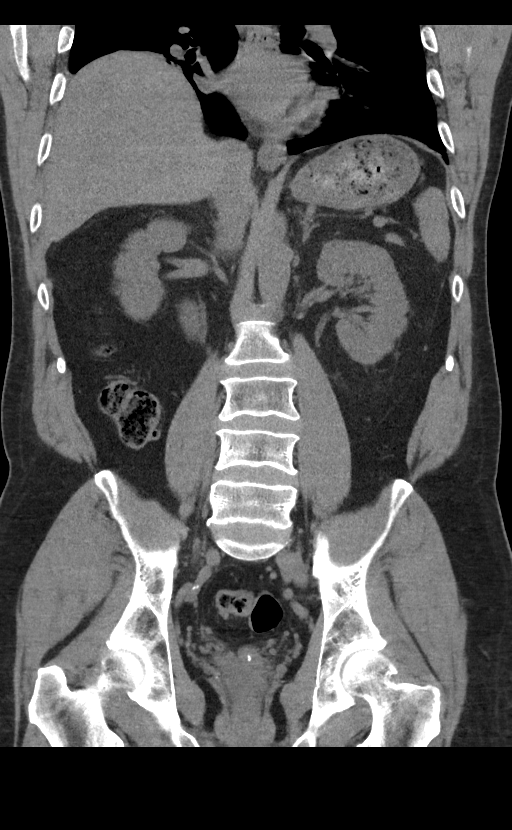

[15 of 46 positions shown; findings below may reference images not displayed]

FINDINGS: Lower chest: No acute pleural or parenchymal lung disease.

Hepatobiliary: No focal liver abnormality is seen. No gallstones,
gallbladder wall thickening, or biliary dilatation.

Pancreas: Unremarkable. No pancreatic ductal dilatation or
surrounding inflammatory changes.

Spleen: Normal in size without focal abnormality.

Adrenals/Urinary Tract: There are 3 distinct punctate less than 3 mm
nonobstructing calculi lower pole left kidney. There is also a
nonobstructing 4 mm distal left ureteral calculus reference image
95. No left-sided obstructive uropathy.

The right kidney and ureter are unremarkable. Bladder is minimally
distended with no focal abnormality. The adrenals are normal.

Stomach/Bowel: No bowel obstruction or ileus. Normal appendix right
lower quadrant. Portions of the proximal sigmoid colon extend into a
left inguinal hernia, with no evidence of bowel incarceration. No
wall thickening or inflammatory change.

Vascular/Lymphatic: Aortic atherosclerosis. No enlarged abdominal or
pelvic lymph nodes.

Reproductive: Prostate is unremarkable.

Other: No free fluid or free gas. Small fat containing umbilical
hernia. Left inguinal hernia contains a portion of the proximal
sigmoid colon without bowel incarceration or strangulation.

Musculoskeletal: No acute or destructive bony lesions. Reconstructed
images demonstrate no additional findings.
IMPRESSION: 1. Nonobstructing 4 mm distal left ureteral calculus.
2. Nonobstructing left renal calculi.
3. Left inguinal hernia containing a portion of the proximal sigmoid
colon without bowel incarceration or strangulation.
4. Aortic Atherosclerosis ([id number]).

## 2021-12-27 ENCOUNTER — Encounter: Payer: Self-pay | Admitting: Internal Medicine

## 2022-01-09 ENCOUNTER — Other Ambulatory Visit: Payer: Self-pay | Admitting: Internal Medicine

## 2022-02-09 ENCOUNTER — Other Ambulatory Visit: Payer: Self-pay | Admitting: Internal Medicine

## 2022-02-22 ENCOUNTER — Encounter: Payer: Self-pay | Admitting: Internal Medicine

## 2022-02-22 ENCOUNTER — Ambulatory Visit (HOSPITAL_BASED_OUTPATIENT_CLINIC_OR_DEPARTMENT_OTHER)
Admission: RE | Admit: 2022-02-22 | Discharge: 2022-02-22 | Disposition: A | Discharge status: Home / Self Care | Payer: BC Managed Care – PPO | Source: Ambulatory Visit | Attending: Internal Medicine | Admitting: Internal Medicine

## 2022-02-22 ENCOUNTER — Ambulatory Visit (INDEPENDENT_AMBULATORY_CARE_PROVIDER_SITE_OTHER): Payer: BC Managed Care – PPO | Admitting: Internal Medicine

## 2022-02-22 VITALS — BP 120/74 | HR 67 | Temp 98.0°F | Resp 16 | Ht 73.0 in | Wt 226.4 lb

## 2022-02-22 DIAGNOSIS — E039 Hypothyroidism, unspecified: Secondary | ICD-10-CM

## 2022-02-22 DIAGNOSIS — Z8639 Personal history of other endocrine, nutritional and metabolic disease: Secondary | ICD-10-CM

## 2022-02-22 DIAGNOSIS — M79671 Pain in right foot: Secondary | ICD-10-CM | POA: Diagnosis not present

## 2022-02-22 DIAGNOSIS — Z8249 Family history of ischemic heart disease and other diseases of the circulatory system: Secondary | ICD-10-CM

## 2022-02-22 DIAGNOSIS — Z125 Encounter for screening for malignant neoplasm of prostate: Secondary | ICD-10-CM | POA: Diagnosis not present

## 2022-02-22 DIAGNOSIS — Z0000 Encounter for general adult medical examination without abnormal findings: Secondary | ICD-10-CM

## 2022-02-22 DIAGNOSIS — R739 Hyperglycemia, unspecified: Secondary | ICD-10-CM | POA: Diagnosis not present

## 2022-02-22 LAB — COMPREHENSIVE METABOLIC PANEL
ALT: 26 U/L (ref 0–53)
AST: 25 U/L (ref 0–37)
Albumin: 4.1 g/dL (ref 3.5–5.2)
Alkaline Phosphatase: 77 U/L (ref 39–117)
BUN: 15 mg/dL (ref 6–23)
CO2: 27 mEq/L (ref 19–32)
Calcium: 9.3 mg/dL (ref 8.4–10.5)
Chloride: 105 mEq/L (ref 96–112)
Creatinine, Ser: 0.96 mg/dL (ref 0.40–1.50)
GFR: 85.59 mL/min (ref >60.00)
Glucose, Bld: 99 mg/dL (ref 70–99)
Potassium: 4.7 mEq/L (ref 3.5–5.1)
Sodium: 140 mEq/L (ref 135–145)
Total Bilirubin: 0.5 mg/dL (ref 0.2–1.2)
Total Protein: 6.9 g/dL (ref 6.0–8.3)

## 2022-02-22 LAB — LIPID PANEL
Cholesterol: 178 mg/dL (ref 0–200)
HDL: 40.8 mg/dL (ref >39.00)
LDL Cholesterol: 107 mg/dL — ABNORMAL HIGH (ref 0–99)
NonHDL: 137.43
Total CHOL/HDL Ratio: 4
Triglycerides: 151 mg/dL — ABNORMAL HIGH (ref 0.0–149.0)
VLDL: 30.2 mg/dL (ref 0.0–40.0)

## 2022-02-22 LAB — CBC WITH DIFFERENTIAL/PLATELET
Basophils Absolute: 0 10*3/uL (ref 0.0–0.1)
Basophils Relative: 0.3 % (ref 0.0–3.0)
Eosinophils Absolute: 0 10*3/uL (ref 0.0–0.7)
Eosinophils Relative: 1 % (ref 0.0–5.0)
HCT: 40.8 % (ref 39.0–52.0)
Hemoglobin: 13.4 g/dL (ref 13.0–17.0)
Lymphocytes Relative: 30.5 % (ref 12.0–46.0)
Lymphs Abs: 1.4 10*3/uL (ref 0.7–4.0)
MCHC: 32.8 g/dL (ref 30.0–36.0)
MCV: 85.9 fl (ref 78.0–100.0)
Monocytes Absolute: 0.6 10*3/uL (ref 0.1–1.0)
Monocytes Relative: 13.3 % — ABNORMAL HIGH (ref 3.0–12.0)
Neutro Abs: 2.6 10*3/uL (ref 1.4–7.7)
Neutrophils Relative %: 54.9 % (ref 43.0–77.0)
Platelets: 293 10*3/uL (ref 150.0–400.0)
RBC: 4.75 Mil/uL (ref 4.22–5.81)
RDW: 13.3 % (ref 11.5–15.5)
WBC: 4.7 10*3/uL (ref 4.0–10.5)

## 2022-02-22 LAB — PSA: PSA: 0.46 ng/mL (ref 0.10–4.00)

## 2022-02-22 LAB — HEMOGLOBIN A1C: Hgb A1c MFr Bld: 6.1 % (ref 4.6–6.5)

## 2022-02-22 LAB — TSH: TSH: 0.02 u[IU]/mL — ABNORMAL LOW (ref 0.35–5.50)

## 2022-02-22 LAB — VITAMIN D 25 HYDROXY (VIT D DEFICIENCY, FRACTURES): VITD: 33.89 ng/mL (ref 30.00–100.00)

## 2022-02-24 ENCOUNTER — Encounter: Payer: Self-pay | Admitting: Internal Medicine

## 2022-02-24 NOTE — Assessment & Plan Note (Signed)
Tdap 2018 S/p Shingrix x2 Covid vaccines: strongly declines .  Prostate cancer screening:  no symptoms, DRE normal, check PSA CCS: Colonoscopy 10/22/2019, next 2028 per GI letter. Labs: CMP, FLP, CBC, A1c, TSH, PSA Diet, exercise: Discussed. ACP info provided

## 2022-02-24 NOTE — Assessment & Plan Note (Signed)
Here for CPX Prediabetes: Check A1c Hypothyroidism: Check TSH Vitamin D deficiency: Had ergocalciferol before, currently on supplements, 1000 units daily.  Check levels. Right ankle pain: As described above, history of remote injury as a teenager, did not need surgery.  Check a x-ray, if symptoms persist will recommend Ortho FH CAD F age 61.  Current CV RF calculated 10%.  Option of a coronary calcium score discussed.  Benefits discussed, he agreed to proceed. RTC 1 year

## 2022-02-25 MED ORDER — LEVOTHYROXINE SODIUM 100 MCG PO TABS: 200.0000 ug | ORAL_TABLET | Freq: Every day | ORAL | 0 refills | Status: DC

## 2022-03-12 ENCOUNTER — Ambulatory Visit (HOSPITAL_BASED_OUTPATIENT_CLINIC_OR_DEPARTMENT_OTHER)
Admission: RE | Admit: 2022-03-12 | Discharge: 2022-03-12 | Disposition: A | Discharge status: Home / Self Care | Payer: BC Managed Care – PPO | Source: Ambulatory Visit | Attending: Internal Medicine | Admitting: Internal Medicine

## 2022-03-12 DIAGNOSIS — Z8249 Family history of ischemic heart disease and other diseases of the circulatory system: Secondary | ICD-10-CM | POA: Insufficient documentation

## 2022-03-16 ENCOUNTER — Other Ambulatory Visit: Payer: Self-pay | Admitting: Internal Medicine

## 2022-05-31 ENCOUNTER — Encounter: Payer: Self-pay | Admitting: Internal Medicine

## 2022-06-06 ENCOUNTER — Other Ambulatory Visit (INDEPENDENT_AMBULATORY_CARE_PROVIDER_SITE_OTHER): Payer: BC Managed Care – PPO

## 2022-06-06 DIAGNOSIS — E039 Hypothyroidism, unspecified: Secondary | ICD-10-CM

## 2022-06-07 LAB — TSH: TSH: 1.95 u[IU]/mL (ref 0.35–5.50)

## 2022-06-10 MED ORDER — LEVOTHYROXINE SODIUM 100 MCG PO TABS: 200.0000 ug | ORAL_TABLET | Freq: Every day | ORAL | 1 refills | Status: DC

## 2022-06-10 NOTE — Addendum Note (Signed)
Addended byDamita Dunnings D on: 06/10/2022 02:25 PM   Modules accepted: Orders

## 2022-06-19 ENCOUNTER — Other Ambulatory Visit: Payer: Self-pay | Admitting: Internal Medicine

## 2022-07-15 ENCOUNTER — Telehealth: Payer: Self-pay | Admitting: Internal Medicine

## 2022-07-15 MED ORDER — LEVOTHYROXINE SODIUM 100 MCG PO TABS: 200.0000 ug | ORAL_TABLET | Freq: Every day | ORAL | 1 refills | Status: DC

## 2022-07-15 NOTE — Telephone Encounter (Signed)
Medication:  levothyroxine (SYNTHROID) 100 MCG tablet   Pt out of pills   Has the patient contacted their pharmacy? Yes.   Unable to get refills; patient is not sure why.    Preferred Pharmacy (with phone number or street name):  [id number] [address], [place] AT [facility] [address] [place], [place] [address] Phone: [phone]  Fax: [phone]    Agent: Please be advised that RX refills may take up to 3 business days. We ask that you follow-up with your pharmacy.

## 2022-07-15 NOTE — Telephone Encounter (Signed)
Refills were sent on 06/10/22- however will send again.

## 2022-07-17 DIAGNOSIS — R1909 Other intra-abdominal and pelvic swelling, mass and lump: Secondary | ICD-10-CM | POA: Diagnosis not present

## 2022-07-17 DIAGNOSIS — R1032 Left lower quadrant pain: Secondary | ICD-10-CM | POA: Diagnosis not present

## 2022-07-22 ENCOUNTER — Other Ambulatory Visit: Payer: Self-pay | Admitting: Surgery

## 2022-07-22 DIAGNOSIS — R1032 Left lower quadrant pain: Secondary | ICD-10-CM

## 2022-07-22 DIAGNOSIS — R1909 Other intra-abdominal and pelvic swelling, mass and lump: Secondary | ICD-10-CM

## 2022-08-14 ENCOUNTER — Ambulatory Visit
Admission: RE | Admit: 2022-08-14 | Discharge: 2022-08-14 | Disposition: A | Discharge status: Home / Self Care | Payer: BC Managed Care – PPO | Source: Ambulatory Visit | Attending: Surgery | Admitting: Surgery

## 2022-08-14 DIAGNOSIS — N200 Calculus of kidney: Secondary | ICD-10-CM | POA: Diagnosis not present

## 2022-08-14 DIAGNOSIS — R1032 Left lower quadrant pain: Secondary | ICD-10-CM

## 2022-08-14 DIAGNOSIS — N2889 Other specified disorders of kidney and ureter: Secondary | ICD-10-CM | POA: Diagnosis not present

## 2022-08-14 DIAGNOSIS — R1909 Other intra-abdominal and pelvic swelling, mass and lump: Secondary | ICD-10-CM

## 2022-08-14 MED ORDER — IOPAMIDOL (ISOVUE-300) INJECTION 61%
100.0000 mL | Freq: Once | INTRAVENOUS | Status: AC | PRN
  Administered 2022-08-14: 100 mL via INTRAVENOUS

## 2022-08-22 NOTE — Progress Notes (Signed)
Reviewed CT scan and attempted to call patient - voicemail box is full.  CT shows small recurrence of left inguinal hernia containing only fatty tissue.  Will ask on of my laparoscopic/robotic surgery partners to review and possibly see patient in consultation.  New 1.6 cm lesion in left kidney of unknown significance.  Will send to primary MD and ask him to evaluate further if necessary with MRI scan as recommended by radiology.  Chandler, MD [facility] [place] [facility] Office: [phone]

## 2022-08-23 ENCOUNTER — Telehealth: Payer: Self-pay | Admitting: Internal Medicine

## 2022-08-23 DIAGNOSIS — N2889 Other specified disorders of kidney and ureter: Secondary | ICD-10-CM

## 2022-08-23 NOTE — Telephone Encounter (Signed)
Last read by Drema Dallas "Brad Stewart" at 12:24 PM on 08/23/2022.

## 2022-08-23 NOTE — Telephone Encounter (Signed)
Incidental finding of a  L kidney lesion  on a CT ordered by surgery to eval a groin mass r/o hernia.  Plan: Abdomen MRI with and without, Dx renal mass. Please advise patient that we are scheduling this as his earliest convenience. I will be happy to talk to him if needed.

## 2022-08-23 NOTE — Telephone Encounter (Signed)
Order placed. Mychart message sent to Pt.

## 2022-08-31 ENCOUNTER — Ambulatory Visit (HOSPITAL_BASED_OUTPATIENT_CLINIC_OR_DEPARTMENT_OTHER): Payer: BC Managed Care – PPO

## 2022-09-07 ENCOUNTER — Ambulatory Visit (HOSPITAL_BASED_OUTPATIENT_CLINIC_OR_DEPARTMENT_OTHER)
Admission: RE | Admit: 2022-09-07 | Discharge: 2022-09-07 | Disposition: A | Discharge status: Home / Self Care | Payer: BC Managed Care – PPO | Source: Ambulatory Visit | Attending: Internal Medicine | Admitting: Internal Medicine

## 2022-09-07 DIAGNOSIS — N2889 Other specified disorders of kidney and ureter: Secondary | ICD-10-CM | POA: Insufficient documentation

## 2022-09-07 DIAGNOSIS — I700 Atherosclerosis of aorta: Secondary | ICD-10-CM | POA: Diagnosis not present

## 2022-09-07 DIAGNOSIS — K3189 Other diseases of stomach and duodenum: Secondary | ICD-10-CM | POA: Diagnosis not present

## 2022-09-07 MED ORDER — GADOBUTROL 1 MMOL/ML IV SOLN
10.0000 mL | Freq: Once | INTRAVENOUS | Status: AC | PRN
  Administered 2022-09-07: 10 mL via INTRAVENOUS

## 2022-09-10 NOTE — Addendum Note (Signed)
Addended byDamita Dunnings D on: 09/10/2022 12:37 PM   Modules accepted: Orders

## 2022-09-23 ENCOUNTER — Encounter: Payer: Self-pay | Admitting: Internal Medicine

## 2022-09-24 ENCOUNTER — Encounter: Payer: Self-pay | Admitting: Urology

## 2022-09-24 ENCOUNTER — Ambulatory Visit (INDEPENDENT_AMBULATORY_CARE_PROVIDER_SITE_OTHER): Payer: BC Managed Care – PPO | Admitting: Urology

## 2022-09-24 VITALS — BP 135/84 | HR 76 | Ht 72.0 in | Wt 210.0 lb

## 2022-09-24 DIAGNOSIS — N2889 Other specified disorders of kidney and ureter: Secondary | ICD-10-CM

## 2022-09-24 LAB — URINALYSIS
Bilirubin, UA: NEGATIVE
Blood, UA: NEGATIVE
Glucose, UA: NEGATIVE mg/dL
Ketones, POC UA: NEGATIVE mg/dL
Leukocytes, UA: NEGATIVE
Nitrite, UA: NEGATIVE
Protein Ur, POC: NEGATIVE mg/dL
Spec Grav, UA: >=1.03 — AB (ref 1.010–1.025)
Urobilinogen, UA: 0.2 E.U./dL
pH, UA: 5.5 (ref 5.0–8.0)

## 2022-09-24 NOTE — Progress Notes (Signed)
Assessment: 1. Renal mass, left    Plan: I personally reviewed the patient's chart including provider notes, lab results, and imaging results. Diagnosis and management options for an incidentally found renal mass discussed with the patient in detail today.  Specifically I discussed management options including active surveillance, surgical excision with partial nephrectomy, and thermal ablation.  I also discussed the role of biopsy in management of a renal mass. Unfortunately, the imaging studies currently available are inconclusive regarding the left renal mass. I would like to discuss the results further with radiology prior to recommending additional evaluation. I will call intact the patient with recommendations after my discussions with radiology.  Addendum 09/24/22: I spoke with Dr. Kris Hartmann with radiology today regarding the MRI results.  The mass is indeterminate, primarily due to motion artifact.  The recommendations include further evaluation with a CT renal mass protocol now or a follow-up study in several months. Recommend further evaluation with a CT renal mass protocol now for definitive management determination.  I personally spent 60 minutes involved in face to face and non-face-to-face activities for this patient on the day of the visit.  Professional time spent included the following activities, in addition to those noted in the documentation: review of imaging studies (CT, MRI), discussion of findings on imaging with patient, discussion of management options for incidentally found renal mass, review of findings with radiology, recommendations for additional evaluation.    Chief Complaint:  Chief Complaint  Patient presents with   renal mass    History of Present Illness:  Brad Stewart is a 62 y.o. male who is seen in consultation from Colon Branch, MD for evaluation of left renal mass.  He underwent CT imaging for evaluation of left groin pain. CT abdomen and  pelvis with contrast from 08/15/2022 showed a 2 mm calculus in the lower pole of the left kidney and a new subcapsular lesion in the anterior midpole of the left kidney measuring 1.6 cm, suspicious for a hemorrhagic cyst versus solid renal mass. MRI of the abdomen with and without contrast from 09/09/2022 showed a 1 cm left renal lesion indeterminate secondary to motion artifact, suspicious for minimally complex cyst versus mildly enhancing solid neoplasm. No history of flank pain or gross hematuria.  No lower urinary tract symptoms other than nocturia x 2. He has a history of kidney stones passing a stone several years ago.  Past Medical History:  Past Medical History:  Diagnosis Date   COVID-19 09/2019   History of kidney stones    Hypothyroidism     Past Surgical History:  Past Surgical History:  Procedure Laterality Date   INGUINAL HERNIA REPAIR Left 06/23/2020   Procedure: OPEN REPAIR LEFT INGUINAL HERNIA WITH MESH;  Surgeon: Armandina Gemma, MD;  Location: [facility];  Service: General;  Laterality: Left;   SHOULDER ARTHROSCOPY Left    UMBILICAL HERNIA REPAIR N/A 06/23/2020   Procedure: OPEN REPAIR UMBILICAL HERNIA WITH MESH;  Surgeon: Armandina Gemma, MD;  Location: [facility];  Service: General;  Laterality: N/A;    Allergies:  Allergies  Allergen Reactions   Sulfonamide Derivatives Nausea And Vomiting    Family History:  Family History  Problem Relation Age of Onset   Coronary artery disease Father 32       dx age 31, had CABG   Bladder Cancer Father    Diabetes Other        grandmother, F (late onset)   Colon cancer Neg Hx    Prostate  cancer Neg Hx    Stroke Neg Hx    Esophageal cancer Neg Hx    Rectal cancer Neg Hx    Stomach cancer Neg Hx     Social History:  Social History   Tobacco Use   Smoking status: Never   Smokeless tobacco: Never  Vaping Use   Vaping Use: Never used  Substance Use Topics   Alcohol use: Yes    Alcohol/week: 0.0 standard drinks of alcohol     Comment: socially    Drug use: No    Review of symptoms:  Constitutional:  Negative for unexplained weight loss, night sweats, fever, chills ENT:  Negative for nose bleeds, sinus pain, painful swallowing CV:  Negative for chest pain, shortness of breath, exercise intolerance, palpitations, loss of consciousness Resp:  Negative for cough, wheezing, shortness of breath GI:  Negative for nausea, vomiting, diarrhea, bloody stools GU:  Positives noted in HPI; otherwise negative for gross hematuria, dysuria, urinary incontinence Neuro:  Negative for seizures, poor balance, limb weakness, slurred speech Psych:  Negative for lack of energy, depression, anxiety Endocrine:  Negative for polydipsia, polyuria, symptoms of hypoglycemia (dizziness, hunger, sweating) Hematologic:  Negative for anemia, purpura, petechia, prolonged or excessive bleeding, use of anticoagulants  Allergic:  Negative for difficulty breathing or choking as a result of exposure to anything; no shellfish allergy; no allergic response (rash/itch) to materials, foods  Physical exam: BP 135/84   Pulse 76   Ht 6' (1.829 m)   Wt 210 lb (95.3 kg)   BMI 28.48 kg/m  GENERAL APPEARANCE:  Well appearing, well developed, well nourished, NAD HEENT: Atraumatic, Normocephalic, oropharynx clear. NECK: Supple without lymphadenopathy or thyromegaly. LUNGS: Clear to auscultation bilaterally. HEART: Regular Rate and Rhythm without murmurs, gallops, or rubs. ABDOMEN: Soft, non-tender, No Masses. EXTREMITIES: Moves all extremities well.  Without clubbing, cyanosis, or edema. NEUROLOGIC:  Alert and oriented x 3, normal gait, CN II-XII grossly intact.  MENTAL STATUS:  Appropriate. BACK:  Non-tender to palpation.  No CVAT SKIN:  Warm, dry and intact.    Results: U/A dipstick: Negative

## 2022-09-27 ENCOUNTER — Ambulatory Visit (HOSPITAL_BASED_OUTPATIENT_CLINIC_OR_DEPARTMENT_OTHER)
Admission: RE | Admit: 2022-09-27 | Discharge: 2022-09-27 | Disposition: A | Discharge status: Home / Self Care | Payer: BC Managed Care – PPO | Source: Ambulatory Visit | Attending: Urology | Admitting: Urology

## 2022-09-27 DIAGNOSIS — N200 Calculus of kidney: Secondary | ICD-10-CM | POA: Diagnosis not present

## 2022-09-27 DIAGNOSIS — N289 Disorder of kidney and ureter, unspecified: Secondary | ICD-10-CM | POA: Diagnosis not present

## 2022-09-27 DIAGNOSIS — E279 Disorder of adrenal gland, unspecified: Secondary | ICD-10-CM | POA: Diagnosis not present

## 2022-09-27 DIAGNOSIS — I700 Atherosclerosis of aorta: Secondary | ICD-10-CM | POA: Diagnosis not present

## 2022-09-27 DIAGNOSIS — N2889 Other specified disorders of kidney and ureter: Secondary | ICD-10-CM | POA: Diagnosis not present

## 2022-09-27 MED ORDER — IOHEXOL 300 MG/ML  SOLN
100.0000 mL | Freq: Once | INTRAMUSCULAR | Status: AC | PRN
  Administered 2022-09-27: 100 mL via INTRAVENOUS

## 2022-09-30 DIAGNOSIS — K4091 Unilateral inguinal hernia, without obstruction or gangrene, recurrent: Secondary | ICD-10-CM | POA: Diagnosis not present

## 2022-10-01 ENCOUNTER — Encounter: Payer: Self-pay | Admitting: Urology

## 2022-10-01 NOTE — Addendum Note (Signed)
Addended by: Primus Bravo on: 10/01/2022 12:51 PM   Modules accepted: Orders

## 2022-10-10 DIAGNOSIS — C641 Malignant neoplasm of right kidney, except renal pelvis: Secondary | ICD-10-CM | POA: Diagnosis not present

## 2022-10-10 DIAGNOSIS — N281 Cyst of kidney, acquired: Secondary | ICD-10-CM | POA: Diagnosis not present

## 2022-11-01 ENCOUNTER — Other Ambulatory Visit: Payer: Self-pay | Admitting: Urology

## 2022-11-18 ENCOUNTER — Other Ambulatory Visit: Payer: Self-pay | Admitting: Urology

## 2022-12-13 ENCOUNTER — Encounter: Payer: Self-pay | Admitting: Internal Medicine

## 2022-12-23 DIAGNOSIS — C641 Malignant neoplasm of right kidney, except renal pelvis: Secondary | ICD-10-CM | POA: Diagnosis not present

## 2022-12-23 DIAGNOSIS — N281 Cyst of kidney, acquired: Secondary | ICD-10-CM | POA: Diagnosis not present

## 2022-12-23 NOTE — Patient Instructions (Addendum)
SURGICAL WAITING ROOM VISITATION  Patients having surgery or a procedure may have no more than 2 support people in the waiting area - these visitors may rotate.    Children under the age of 69 must have an adult with them who is not the patient.  Due to an increase in RSV and influenza rates and associated hospitalizations, children ages 7 and under may not visit patients in [facility] hospitals.  If the patient needs to stay at the hospital during part of their recovery, the visitor guidelines for inpatient rooms apply. Pre-op nurse will coordinate an appropriate time for 1 support person to accompany patient in pre-op.  This support person may not rotate.    Please refer to the [facility] website for the visitor guidelines for Inpatients (after your surgery is over and you are in a regular room).       Your procedure is scheduled on: 01-08-23   Report to [facility] Main Entrance    Report to admitting at 6:15 AM   Call this number if you have problems the morning of surgery [phone]    Follow a clear liquid diet the day before surgery    Do not eat or drink liquids:After Midnight.          If you have questions, please contact your surgeon's office.   FOLLOW BOWEL PREP AND ANY ADDITIONAL PRE OP INSTRUCTIONS YOU RECEIVED FROM YOUR SURGEON'S OFFICE!!!   Magnesium Citrate -  Drink by noon the day before surgery.   Oral Hygiene is also important to reduce your risk of infection.                                    Remember - BRUSH YOUR TEETH THE MORNING OF SURGERY WITH YOUR REGULAR TOOTHPASTE  DENTURES WILL BE REMOVED PRIOR TO SURGERY PLEASE DO NOT APPLY "Poly grip" OR ADHESIVES!!!   Do NOT smoke after Midnight   Take these medicines the morning of surgery with A SIP OF WATER:   Synthroid (Levothyroxine)                              You may not have any metal on your body including jewelry, and body piercing             Do not wear lotions, powders, cologne,  or deodorant              Men may shave face and neck.   Do not bring valuables to the hospital. [facility] IS NOT  RESPONSIBLE   FOR VALUABLES.   Contacts, glasses, dentures or bridgework may not be worn into surgery.   Bring small overnight bag day of surgery.   DO NOT BRING YOUR HOME MEDICATIONS TO THE HOSPITAL. PHARMACY WILL DISPENSE MEDICATIONS LISTED ON YOUR MEDICATION LIST TO YOU DURING YOUR ADMISSION IN THE HOSPITAL!               Please read over the following fact sheets you were given: IF YOU HAVE QUESTIONS ABOUT YOUR PRE-OP INSTRUCTIONS PLEASE CALL [phone] [facility]   If you received a COVID test during your pre-op visit  it is requested that you wear a mask when out in public, stay away from anyone that may not be feeling well and notify your surgeon if you develop symptoms. If you test positive for Covid or  have been in contact with anyone that has tested positive in the last 10 days please notify you surgeon.    [place] [place] - Preparing for Surgery Before surgery, you can play an important role.  Because skin is not sterile, your skin needs to be as free of germs as possible.  You can reduce the number of germs on your skin by washing with CHG (chlorahexidine gluconate) soap before surgery.  CHG is an antiseptic cleaner which kills germs and bonds with the skin to continue killing germs even after washing. Please DO NOT use if you have an allergy to CHG or antibacterial soaps.  If your skin becomes reddened/irritated stop using the CHG and inform your nurse when you arrive at Short Stay. Do not shave (including legs and underarms) for at least 48 hours prior to the first CHG shower.  You may shave your face/neck.  Please follow these instructions carefully:  1.  Shower with CHG Soap the night before surgery and the  morning of surgery.  2.  If you choose to wash your hair, wash your hair first as usual with your normal  shampoo.  3.  After you shampoo, rinse your hair and  body thoroughly to remove the shampoo.                             4.  Use CHG as you would any other liquid soap.  You can apply chg directly to the skin and wash.  Gently with a scrungie or clean washcloth.  5.  Apply the CHG Soap to your body ONLY FROM THE NECK DOWN.   Do   not use on face/ open                           Wound or open sores. Avoid contact with eyes, ears mouth and   genitals (private parts).                       Wash face,  Genitals (private parts) with your normal soap.             6.  Wash thoroughly, paying special attention to the area where your    surgery  will be performed.  7.  Thoroughly rinse your body with warm water from the neck down.  8.  DO NOT shower/wash with your normal soap after using and rinsing off the CHG Soap.                9.  Pat yourself dry with a clean towel.            10.  Wear clean pajamas.            11.  Place clean sheets on your bed the night of your first shower and do not  sleep with pets. Day of Surgery : Do not apply any lotions/deodorants the morning of surgery.  Please wear clean clothes to the hospital/surgery center.  FAILURE TO FOLLOW THESE INSTRUCTIONS MAY RESULT IN THE CANCELLATION OF YOUR SURGERY  PATIENT SIGNATURE_________________________________  NURSE SIGNATURE__________________________________  ________________________________________________________________________  WHAT IS A BLOOD TRANSFUSION? Blood Transfusion Information  A transfusion is the replacement of blood or some of its parts. Blood is made up of multiple cells which provide different functions. Red blood cells carry oxygen and are used for blood loss replacement. White blood cells fight against  infection. Platelets control bleeding. Plasma helps clot blood. Other blood products are available for specialized needs, such as hemophilia or other clotting disorders. BEFORE THE TRANSFUSION  Who gives blood for transfusions?  Healthy volunteers who are  fully evaluated to make sure their blood is safe. This is blood bank blood. Transfusion therapy is the safest it has ever been in the practice of medicine. Before blood is taken from a donor, a complete history is taken to make sure that person has no history of diseases nor engages in risky social behavior (examples are intravenous drug use or sexual activity with multiple partners). The donor's travel history is screened to minimize risk of transmitting infections, such as malaria. The donated blood is tested for signs of infectious diseases, such as HIV and hepatitis. The blood is then tested to be sure it is compatible with you in order to minimize the chance of a transfusion reaction. If you or a relative donates blood, this is often done in anticipation of surgery and is not appropriate for emergency situations. It takes many days to process the donated blood. RISKS AND COMPLICATIONS Although transfusion therapy is very safe and saves many lives, the main dangers of transfusion include:  Getting an infectious disease. Developing a transfusion reaction. This is an allergic reaction to something in the blood you were given. Every precaution is taken to prevent this. The decision to have a blood transfusion has been considered carefully by your caregiver before blood is given. Blood is not given unless the benefits outweigh the risks. AFTER THE TRANSFUSION Right after receiving a blood transfusion, you will usually feel much better and more energetic. This is especially true if your red blood cells have gotten low (anemic). The transfusion raises the level of the red blood cells which carry oxygen, and this usually causes an energy increase. The nurse administering the transfusion will monitor you carefully for complications. HOME CARE INSTRUCTIONS  No special instructions are needed after a transfusion. You may find your energy is better. Speak with your caregiver about any limitations on activity for  underlying diseases you may have. SEEK MEDICAL CARE IF:  Your condition is not improving after your transfusion. You develop redness or irritation at the intravenous (IV) site. SEEK IMMEDIATE MEDICAL CARE IF:  Any of the following symptoms occur over the next 12 hours: Shaking chills. You have a temperature by mouth above 102 F (38.9 C), not controlled by medicine. Chest, back, or muscle pain. People around you feel you are not acting correctly or are confused. Shortness of breath or difficulty breathing. Dizziness and fainting. You get a rash or develop hives. You have a decrease in urine output. Your urine turns a dark color or changes to pink, red, or brown. Any of the following symptoms occur over the next 10 days: You have a temperature by mouth above 102 F (38.9 C), not controlled by medicine. Shortness of breath. Weakness after normal activity. The white part of the eye turns yellow (jaundice). You have a decrease in the amount of urine or are urinating less often. Your urine turns a dark color or changes to pink, red, or brown. Document Released: 08/16/2000 Document Revised: 11/11/2011 Document Reviewed: 04/04/2008 [facility] Patient Information 2014 [place], [place].  _______________________________________________________________________

## 2022-12-24 NOTE — Progress Notes (Signed)
Please place orders for PAT appointment scheduled for 12/2622.

## 2022-12-24 NOTE — Progress Notes (Signed)
COVID Vaccine Completed:  Date of COVID positive in last 90 days:  PCP - Willow Ora, MD Cardiologist -   Chest x-ray -  EKG -  Stress Test -  ECHO -  Cardiac Cath -  Pacemaker/ICD device last checked: Spinal Cord Stimulator:  Bowel Prep -   Sleep Study -  CPAP -   Fasting Blood Sugar - preDM Checks Blood Sugar _____ times a day  Last dose of GLP1 agonist-  N/A GLP1 instructions:  N/A   Last dose of SGLT-2 inhibitors-  N/A SGLT-2 instructions: N/A   Blood Thinner Instructions:  Time Aspirin Instructions: Last Dose:  Activity level:  Can go up a flight of stairs and perform activities of daily living without stopping and without symptoms of chest pain or shortness of breath.  Able to exercise without symptoms  Unable to go up a flight of stairs without symptoms of     Anesthesia review:   Patient denies shortness of breath, fever, cough and chest pain at PAT appointment  Patient verbalized understanding of instructions that were given to them at the PAT appointment. Patient was also instructed that they will need to review over the PAT instructions again at home before surgery.

## 2022-12-25 NOTE — Progress Notes (Addendum)
COVID Vaccine Completed:  No  Date of COVID positive in last 90 days:  No  PCP - Willow Ora, MD Cardiologist - N/A  Chest x-ray -  N/A EKG -  N/A Stress Test -  N/A ECHO -  N/A Cardiac Cath -  N/A Pacemaker/ICD device last checked: Spinal Cord Stimulator: N/A Cardiac CT - 03-12-22 Epic  Bowel Prep -  Clear liquids day before & Mag Citrate.  Patient has instructions  Sleep Study - N/A CPAP -   Hx of elevated A1c, no diagnosis of prediabetes. CBG 130 at PAT Fasting Blood Sugar -  Checks Blood Sugar - does not check   Last dose of GLP1 agonist-  N/A GLP1 instructions:  N/A   Last dose of SGLT-2 inhibitors-  N/A SGLT-2 instructions: N/A  Blood Thinner Instructions:  N/A Aspirin Instructions: Last Dose:  Activity level:  Can go up a flight of stairs and perform activities of daily living without stopping and without symptoms of chest pain or shortness of breath. Able to exercise without symptoms  Anesthesia review:  N/A  Patient denies shortness of breath, fever, cough and chest pain at PAT appointment  Patient verbalized understanding of instructions that were given to them at the PAT appointment. Patient was also instructed that they will need to review over the PAT instructions again at home before surgery.

## 2022-12-27 ENCOUNTER — Ambulatory Visit: Payer: Self-pay | Admitting: General Surgery

## 2022-12-27 ENCOUNTER — Encounter (HOSPITAL_COMMUNITY): Payer: Self-pay

## 2022-12-27 ENCOUNTER — Encounter (HOSPITAL_COMMUNITY)
Admission: RE | Admit: 2022-12-27 | Discharge: 2022-12-27 | Disposition: A | Discharge status: Home / Self Care | Payer: BC Managed Care – PPO | Source: Ambulatory Visit | Attending: Urology | Admitting: Urology

## 2022-12-27 ENCOUNTER — Other Ambulatory Visit: Payer: Self-pay

## 2022-12-27 VITALS — BP 127/83 | HR 69 | Temp 98.3°F | Resp 16 | Ht 72.0 in | Wt 227.0 lb

## 2022-12-27 DIAGNOSIS — R7303 Prediabetes: Secondary | ICD-10-CM | POA: Insufficient documentation

## 2022-12-27 DIAGNOSIS — Z01818 Encounter for other preprocedural examination: Secondary | ICD-10-CM

## 2022-12-27 DIAGNOSIS — Z01812 Encounter for preprocedural laboratory examination: Secondary | ICD-10-CM | POA: Diagnosis not present

## 2022-12-27 HISTORY — DX: Other specified postprocedural states: R11.2

## 2022-12-27 HISTORY — DX: Other specified postprocedural states: Z98.890

## 2022-12-27 LAB — CBC
HCT: 39.9 % (ref 39.0–52.0)
Hemoglobin: 12.8 g/dL — ABNORMAL LOW (ref 13.0–17.0)
MCH: 27.8 pg (ref 26.0–34.0)
MCHC: 32.1 g/dL (ref 30.0–36.0)
MCV: 86.6 fL (ref 80.0–100.0)
Platelets: 372 10*3/uL (ref 150–400)
RBC: 4.61 MIL/uL (ref 4.22–5.81)
RDW: 13.9 % (ref 11.5–15.5)
WBC: 4.4 10*3/uL (ref 4.0–10.5)
nRBC: 0 % (ref 0.0–0.2)

## 2022-12-27 LAB — HEMOGLOBIN A1C
Hgb A1c MFr Bld: 6 % — ABNORMAL HIGH (ref 4.8–5.6)
Mean Plasma Glucose: 125.5 mg/dL

## 2022-12-27 LAB — GLUCOSE, CAPILLARY: Glucose-Capillary: 130 mg/dL — ABNORMAL HIGH (ref 70–99)

## 2022-12-27 LAB — TYPE AND SCREEN
ABO/RH(D): O POS
Antibody Screen: NEGATIVE

## 2022-12-27 NOTE — Progress Notes (Signed)
Surgery orders requested via Epic inbox. °

## 2023-01-07 NOTE — Anesthesia Preprocedure Evaluation (Signed)
Anesthesia Evaluation  Patient identified by MRN, date of birth, ID band Patient awake    Reviewed: Allergy & Precautions, NPO status , Patient's Chart, lab work & pertinent test results  History of Anesthesia Complications (+) PONV  Airway Mallampati: II  TM Distance: >3 FB Neck ROM: Full    Dental no notable dental hx. (+) Dental Advisory Given   Pulmonary neg pulmonary ROS   Pulmonary exam normal        Cardiovascular negative cardio ROS Normal cardiovascular exam     Neuro/Psych negative neurological ROS     GI/Hepatic negative GI ROS, Neg liver ROS,,,  Endo/Other  Hypothyroidism    Renal/GU negative Renal ROS     Musculoskeletal negative musculoskeletal ROS (+)    Abdominal   Peds  Hematology negative hematology ROS (+)   Anesthesia Other Findings   Reproductive/Obstetrics                             Anesthesia Physical Anesthesia Plan  ASA: 2  Anesthesia Plan: General   Post-op Pain Management: Tylenol PO (pre-op)* and Toradol IV (intra-op)*   Induction: Intravenous  PONV Risk Score and Plan: 4 or greater and Ondansetron, Dexamethasone, Midazolam, Scopolamine patch - Pre-op and TIVA  Airway Management Planned: Oral ETT  Additional Equipment:   Intra-op Plan:   Post-operative Plan: Extubation in OR  Informed Consent: I have reviewed the patients History and Physical, chart, labs and discussed the procedure including the risks, benefits and alternatives for the proposed anesthesia with the patient or authorized representative who has indicated his/her understanding and acceptance.     Dental advisory given  Plan Discussed with: Anesthesiologist and CRNA  Anesthesia Plan Comments:        Anesthesia Quick Evaluation

## 2023-01-08 ENCOUNTER — Encounter (HOSPITAL_COMMUNITY)
Admission: RE | Disposition: A | Discharge status: Home / Self Care | Payer: Self-pay | Source: Ambulatory Visit | Attending: Urology

## 2023-01-08 ENCOUNTER — Inpatient Hospital Stay (HOSPITAL_COMMUNITY)
Admission: RE | Admit: 2023-01-08 | Discharge: 2023-01-09 | DRG: 658 | Disposition: A | Discharge status: Home / Self Care | Payer: BC Managed Care – PPO | Source: Ambulatory Visit | Attending: Urology | Admitting: Urology

## 2023-01-08 ENCOUNTER — Encounter (HOSPITAL_COMMUNITY): Payer: Self-pay | Admitting: Urology

## 2023-01-08 ENCOUNTER — Other Ambulatory Visit: Payer: Self-pay

## 2023-01-08 ENCOUNTER — Inpatient Hospital Stay (HOSPITAL_COMMUNITY): Payer: BC Managed Care – PPO | Admitting: Anesthesiology

## 2023-01-08 DIAGNOSIS — Z882 Allergy status to sulfonamides status: Secondary | ICD-10-CM

## 2023-01-08 DIAGNOSIS — E039 Hypothyroidism, unspecified: Secondary | ICD-10-CM | POA: Diagnosis not present

## 2023-01-08 DIAGNOSIS — Z79890 Hormone replacement therapy (postmenopausal): Secondary | ICD-10-CM | POA: Diagnosis not present

## 2023-01-08 DIAGNOSIS — Z8616 Personal history of COVID-19: Secondary | ICD-10-CM | POA: Diagnosis not present

## 2023-01-08 DIAGNOSIS — C641 Malignant neoplasm of right kidney, except renal pelvis: Secondary | ICD-10-CM | POA: Diagnosis not present

## 2023-01-08 DIAGNOSIS — K4091 Unilateral inguinal hernia, without obstruction or gangrene, recurrent: Secondary | ICD-10-CM | POA: Diagnosis present

## 2023-01-08 DIAGNOSIS — C642 Malignant neoplasm of left kidney, except renal pelvis: Secondary | ICD-10-CM | POA: Diagnosis not present

## 2023-01-08 DIAGNOSIS — N281 Cyst of kidney, acquired: Secondary | ICD-10-CM | POA: Diagnosis present

## 2023-01-08 DIAGNOSIS — Z79899 Other long term (current) drug therapy: Secondary | ICD-10-CM | POA: Diagnosis not present

## 2023-01-08 DIAGNOSIS — N2889 Other specified disorders of kidney and ureter: Principal | ICD-10-CM | POA: Diagnosis present

## 2023-01-08 DIAGNOSIS — Z8249 Family history of ischemic heart disease and other diseases of the circulatory system: Secondary | ICD-10-CM | POA: Diagnosis not present

## 2023-01-08 HISTORY — PX: INGUINAL HERNIA REPAIR: SHX194

## 2023-01-08 HISTORY — PX: ROBOTIC ASSITED PARTIAL NEPHRECTOMY: SHX6087

## 2023-01-08 LAB — HEMOGLOBIN AND HEMATOCRIT, BLOOD
HCT: 38.7 % — ABNORMAL LOW (ref 39.0–52.0)
Hemoglobin: 12.2 g/dL — ABNORMAL LOW (ref 13.0–17.0)

## 2023-01-08 LAB — ABO/RH: ABO/RH(D): O POS

## 2023-01-08 SURGERY — NEPHRECTOMY, PARTIAL, ROBOT-ASSISTED
Anesthesia: General | Laterality: Left

## 2023-01-08 MED ORDER — SODIUM CHLORIDE 0.45 % IV SOLN: INTRAVENOUS | Status: DC

## 2023-01-08 MED ORDER — ROCURONIUM BROMIDE 10 MG/ML (PF) SYRINGE
PREFILLED_SYRINGE | INTRAVENOUS | Status: DC | PRN
  Administered 2023-01-08 (×3): 10 mg via INTRAVENOUS
  Administered 2023-01-08: 100 mg via INTRAVENOUS
  Administered 2023-01-08: 40 mg via INTRAVENOUS

## 2023-01-08 MED ORDER — STERILE WATER FOR IRRIGATION IR SOLN
Status: DC | PRN
  Administered 2023-01-08: 1000 mL

## 2023-01-08 MED ORDER — LACTATED RINGERS IV SOLN: INTRAVENOUS | Status: DC | PRN

## 2023-01-08 MED ORDER — 0.9 % SODIUM CHLORIDE (POUR BTL) OPTIME
TOPICAL | Status: DC | PRN
  Administered 2023-01-08: 1000 mL

## 2023-01-08 MED ORDER — AMISULPRIDE (ANTIEMETIC) 5 MG/2ML IV SOLN: 10.0000 mg | Freq: Once | INTRAVENOUS | Status: DC | PRN

## 2023-01-08 MED ORDER — DEXAMETHASONE SODIUM PHOSPHATE 10 MG/ML IJ SOLN
INTRAMUSCULAR | Status: AC
  Filled 2023-01-08: qty 1

## 2023-01-08 MED ORDER — DOCUSATE SODIUM 100 MG PO CAPS: 100.0000 mg | ORAL_CAPSULE | Freq: Two times a day (BID) | ORAL | Status: DC

## 2023-01-08 MED ORDER — HYDROCODONE-ACETAMINOPHEN 5-325 MG PO TABS: 1.0000 | ORAL_TABLET | Freq: Four times a day (QID) | ORAL | 0 refills | Status: DC | PRN

## 2023-01-08 MED ORDER — ENSURE PRE-SURGERY PO LIQD
296.0000 mL | Freq: Once | ORAL | Status: DC
  Filled 2023-01-08: qty 296

## 2023-01-08 MED ORDER — HEMOSTATIC AGENTS (NO CHARGE) OPTIME
TOPICAL | Status: DC | PRN
  Administered 2023-01-08: 1 via TOPICAL

## 2023-01-08 MED ORDER — ALBUMIN HUMAN 5 % IV SOLN
INTRAVENOUS | Status: AC
  Filled 2023-01-08: qty 250

## 2023-01-08 MED ORDER — MIDAZOLAM HCL 2 MG/2ML IJ SOLN
INTRAMUSCULAR | Status: AC
  Filled 2023-01-08: qty 2

## 2023-01-08 MED ORDER — ONDANSETRON HCL 4 MG/2ML IJ SOLN
INTRAMUSCULAR | Status: DC | PRN
  Administered 2023-01-08: 4 mg via INTRAVENOUS

## 2023-01-08 MED ORDER — MAGNESIUM CITRATE PO SOLN: 1.0000 | Freq: Once | ORAL | Status: DC

## 2023-01-08 MED ORDER — BUPIVACAINE HCL 0.25 % IJ SOLN
INTRAMUSCULAR | Status: DC | PRN
  Administered 2023-01-08: 48 mL

## 2023-01-08 MED ORDER — LEVOTHYROXINE SODIUM 100 MCG PO TABS
200.0000 ug | ORAL_TABLET | Freq: Every day | ORAL | Status: DC
  Administered 2023-01-09: 200 ug via ORAL
  Filled 2023-01-08: qty 2

## 2023-01-08 MED ORDER — PROPOFOL 1000 MG/100ML IV EMUL
INTRAVENOUS | Status: AC
  Filled 2023-01-08: qty 100

## 2023-01-08 MED ORDER — CHLORHEXIDINE GLUCONATE 0.12 % MT SOLN
15.0000 mL | Freq: Once | OROMUCOSAL | Status: AC
  Administered 2023-01-08: 15 mL via OROMUCOSAL

## 2023-01-08 MED ORDER — CHLORHEXIDINE GLUCONATE CLOTH 2 % EX PADS: 6.0000 | MEDICATED_PAD | Freq: Once | CUTANEOUS | Status: DC

## 2023-01-08 MED ORDER — DOCUSATE SODIUM 100 MG PO CAPS
100.0000 mg | ORAL_CAPSULE | Freq: Two times a day (BID) | ORAL | Status: DC
  Administered 2023-01-08 – 2023-01-09 (×2): 100 mg via ORAL
  Filled 2023-01-08 (×2): qty 1

## 2023-01-08 MED ORDER — FENTANYL CITRATE (PF) 100 MCG/2ML IJ SOLN
INTRAMUSCULAR | Status: DC | PRN
  Administered 2023-01-08: 100 ug via INTRAVENOUS
  Administered 2023-01-08: 50 ug via INTRAVENOUS
  Administered 2023-01-08: 100 ug via INTRAVENOUS

## 2023-01-08 MED ORDER — ONDANSETRON HCL 4 MG/2ML IJ SOLN
INTRAMUSCULAR | Status: AC
  Filled 2023-01-08: qty 2

## 2023-01-08 MED ORDER — DIPHENHYDRAMINE HCL 50 MG/ML IJ SOLN: 12.5000 mg | Freq: Four times a day (QID) | INTRAMUSCULAR | Status: DC | PRN

## 2023-01-08 MED ORDER — ACETAMINOPHEN 10 MG/ML IV SOLN
1000.0000 mg | Freq: Four times a day (QID) | INTRAVENOUS | Status: AC
  Administered 2023-01-08 – 2023-01-09 (×4): 1000 mg via INTRAVENOUS
  Filled 2023-01-08 (×4): qty 100

## 2023-01-08 MED ORDER — TRIPLE ANTIBIOTIC 3.5-400-5000 EX OINT: 1.0000 | TOPICAL_OINTMENT | Freq: Three times a day (TID) | CUTANEOUS | Status: DC | PRN

## 2023-01-08 MED ORDER — MIDAZOLAM HCL 5 MG/5ML IJ SOLN
INTRAMUSCULAR | Status: DC | PRN
  Administered 2023-01-08: 2 mg via INTRAVENOUS

## 2023-01-08 MED ORDER — HYOSCYAMINE SULFATE 0.125 MG SL SUBL: 0.1250 mg | SUBLINGUAL_TABLET | SUBLINGUAL | Status: DC | PRN

## 2023-01-08 MED ORDER — LACTATED RINGERS IR SOLN
Status: DC | PRN
  Administered 2023-01-08: 1000 mL

## 2023-01-08 MED ORDER — LACTATED RINGERS IV SOLN: INTRAVENOUS | Status: DC

## 2023-01-08 MED ORDER — DEXAMETHASONE SODIUM PHOSPHATE 10 MG/ML IJ SOLN
INTRAMUSCULAR | Status: DC | PRN
  Administered 2023-01-08: 10 mg via INTRAVENOUS

## 2023-01-08 MED ORDER — CEFAZOLIN SODIUM-DEXTROSE 2-4 GM/100ML-% IV SOLN
2.0000 g | INTRAVENOUS | Status: AC
  Administered 2023-01-08: 2 g via INTRAVENOUS
  Filled 2023-01-08: qty 100

## 2023-01-08 MED ORDER — FENTANYL CITRATE PF 50 MCG/ML IJ SOSY
25.0000 ug | PREFILLED_SYRINGE | INTRAMUSCULAR | Status: DC | PRN
  Administered 2023-01-08 (×2): 50 ug via INTRAVENOUS

## 2023-01-08 MED ORDER — FENTANYL CITRATE PF 50 MCG/ML IJ SOSY
PREFILLED_SYRINGE | INTRAMUSCULAR | Status: AC
  Filled 2023-01-08: qty 2

## 2023-01-08 MED ORDER — LIDOCAINE 2% (20 MG/ML) 5 ML SYRINGE
INTRAMUSCULAR | Status: DC | PRN
  Administered 2023-01-08: 100 mg via INTRAVENOUS

## 2023-01-08 MED ORDER — PROPOFOL 10 MG/ML IV BOLUS
INTRAVENOUS | Status: DC | PRN
  Administered 2023-01-08: 200 mg via INTRAVENOUS

## 2023-01-08 MED ORDER — HYDROMORPHONE HCL 1 MG/ML IJ SOLN
0.5000 mg | INTRAMUSCULAR | Status: DC | PRN
  Administered 2023-01-08: 1 mg via INTRAVENOUS
  Filled 2023-01-08: qty 1

## 2023-01-08 MED ORDER — PROPOFOL 500 MG/50ML IV EMUL
INTRAVENOUS | Status: DC | PRN
  Administered 2023-01-08: 160 ug/kg/min via INTRAVENOUS

## 2023-01-08 MED ORDER — ROCURONIUM BROMIDE 10 MG/ML (PF) SYRINGE
PREFILLED_SYRINGE | INTRAVENOUS | Status: AC
  Filled 2023-01-08: qty 10

## 2023-01-08 MED ORDER — ONDANSETRON HCL 4 MG/2ML IJ SOLN: 4.0000 mg | INTRAMUSCULAR | Status: DC | PRN

## 2023-01-08 MED ORDER — ALBUMIN HUMAN 5 % IV SOLN: INTRAVENOUS | Status: DC | PRN

## 2023-01-08 MED ORDER — OXYCODONE HCL 5 MG PO TABS: 5.0000 mg | ORAL_TABLET | ORAL | Status: DC | PRN

## 2023-01-08 MED ORDER — FENTANYL CITRATE (PF) 250 MCG/5ML IJ SOLN
INTRAMUSCULAR | Status: AC
  Filled 2023-01-08: qty 5

## 2023-01-08 MED ORDER — ORAL CARE MOUTH RINSE: 15.0000 mL | Freq: Once | OROMUCOSAL | Status: AC

## 2023-01-08 MED ORDER — PROPOFOL 10 MG/ML IV BOLUS
INTRAVENOUS | Status: AC
  Filled 2023-01-08: qty 20

## 2023-01-08 MED ORDER — SCOPOLAMINE 1 MG/3DAYS TD PT72
1.0000 | MEDICATED_PATCH | TRANSDERMAL | Status: DC
  Administered 2023-01-08: 1.5 mg via TRANSDERMAL
  Filled 2023-01-08 (×2): qty 1

## 2023-01-08 MED ORDER — BUPIVACAINE LIPOSOME 1.3 % IJ SUSP
INTRAMUSCULAR | Status: AC
  Filled 2023-01-08: qty 20

## 2023-01-08 MED ORDER — ACETAMINOPHEN 500 MG PO TABS
1000.0000 mg | ORAL_TABLET | ORAL | Status: AC
  Administered 2023-01-08: 1000 mg via ORAL
  Filled 2023-01-08: qty 2

## 2023-01-08 MED ORDER — ACETAMINOPHEN 500 MG PO TABS: 1000.0000 mg | ORAL_TABLET | Freq: Once | ORAL | Status: DC

## 2023-01-08 MED ORDER — PROMETHAZINE HCL 25 MG/ML IJ SOLN: 6.2500 mg | INTRAMUSCULAR | Status: DC | PRN

## 2023-01-08 MED ORDER — SODIUM CHLORIDE (PF) 0.9 % IJ SOLN
INTRAMUSCULAR | Status: AC
  Filled 2023-01-08: qty 20

## 2023-01-08 MED ORDER — DIPHENHYDRAMINE HCL 12.5 MG/5ML PO ELIX: 12.5000 mg | ORAL_SOLUTION | Freq: Four times a day (QID) | ORAL | Status: DC | PRN

## 2023-01-08 MED ORDER — BUPIVACAINE-EPINEPHRINE 0.25% -1:200000 IJ SOLN: INTRAMUSCULAR | Status: DC | PRN

## 2023-01-08 MED ORDER — SUGAMMADEX SODIUM 200 MG/2ML IV SOLN
INTRAVENOUS | Status: DC | PRN
  Administered 2023-01-08: 200 mg via INTRAVENOUS

## 2023-01-08 MED ORDER — CEFAZOLIN SODIUM-DEXTROSE 2-4 GM/100ML-% IV SOLN: 2.0000 g | INTRAVENOUS | Status: DC

## 2023-01-08 MED ORDER — BUPIVACAINE HCL 0.25 % IJ SOLN
INTRAMUSCULAR | Status: AC
  Filled 2023-01-08: qty 1

## 2023-01-08 MED ORDER — LIDOCAINE HCL (PF) 2 % IJ SOLN
INTRAMUSCULAR | Status: AC
  Filled 2023-01-08: qty 5

## 2023-01-08 SURGICAL SUPPLY — 109 items
ADH SKN CLS APL DERMABOND .7 (GAUZE/BANDAGES/DRESSINGS) ×2
AGENT HMST KT MTR STRL THRMB (HEMOSTASIS) ×1
APL ESCP 34 STRL LF DISP (HEMOSTASIS) ×1
APL PRP STRL LF DISP 70% ISPRP (MISCELLANEOUS) ×2
APPLICATOR SURGIFLO ENDO (HEMOSTASIS) ×1 IMPLANT
BAG COUNTER SPONGE SURGICOUNT (BAG) IMPLANT
BAG SPNG CNTER NS LX DISP (BAG)
BLADE SURG 15 STRL LF DISP TIS (BLADE) IMPLANT
BLADE SURG 15 STRL SS (BLADE)
BLADE SURG SZ10 CARB STEEL (BLADE) ×1 IMPLANT
CABLE HIGH FREQUENCY MONO STRZ (ELECTRODE) IMPLANT
CHLORAPREP W/TINT 26 (MISCELLANEOUS) ×2 IMPLANT
CLIP LIGATING HEM O LOK PURPLE (MISCELLANEOUS) ×2 IMPLANT
CLIP LIGATING HEMO LOK XL GOLD (MISCELLANEOUS) IMPLANT
CLIP LIGATING HEMO O LOK GREEN (MISCELLANEOUS) ×1 IMPLANT
CLIP SUT LAPRA TY ABSORB (SUTURE) ×1 IMPLANT
COVER SURGICAL LIGHT HANDLE (MISCELLANEOUS) ×1 IMPLANT
COVER TIP SHEARS 8 DVNC (MISCELLANEOUS) ×1 IMPLANT
CUTTER ECHEON FLEX ENDO 45 340 (ENDOMECHANICALS) IMPLANT
DERMABOND ADVANCED .7 DNX12 (GAUZE/BANDAGES/DRESSINGS) ×2 IMPLANT
DRAIN CHANNEL 15F RND FF 3/16 (WOUND CARE) ×1 IMPLANT
DRAIN PENROSE 0.5X18 (DRAIN) ×1 IMPLANT
DRAPE ARM DVNC X/XI (DISPOSABLE) ×4 IMPLANT
DRAPE COLUMN DVNC XI (DISPOSABLE) ×1 IMPLANT
DRAPE INCISE IOBAN 66X45 STRL (DRAPES) ×1 IMPLANT
DRAPE LAPAROSCOPIC ABDOMINAL (DRAPES) ×1 IMPLANT
DRAPE SHEET LG 3/4 BI-LAMINATE (DRAPES) ×1 IMPLANT
DRIVER NDL LRG 8 DVNC XI (INSTRUMENTS) ×3 IMPLANT
DRIVER NDLE LRG 8 DVNC XI (INSTRUMENTS) ×2 IMPLANT
DRSG TEGADERM 4X4.75 (GAUZE/BANDAGES/DRESSINGS) ×1 IMPLANT
ELECT PENCIL ROCKER SW 15FT (MISCELLANEOUS) ×1 IMPLANT
ELECT REM PT RETURN 15FT ADLT (MISCELLANEOUS) ×1 IMPLANT
EVACUATOR SILICONE 100CC (DRAIN) ×1 IMPLANT
FORCEPS BPLR FENES DVNC XI (FORCEP) ×1 IMPLANT
FORCEPS PROGRASP DVNC XI (FORCEP) ×1 IMPLANT
GAUZE 4X4 16PLY ~~LOC~~+RFID DBL (SPONGE) ×2 IMPLANT
GAUZE SPONGE 2X2 8PLY STRL LF (GAUZE/BANDAGES/DRESSINGS) ×1 IMPLANT
GLOVE BIO SURGEON STRL SZ 6.5 (GLOVE) ×1 IMPLANT
GLOVE BIO SURGEON STRL SZ7.5 (GLOVE) ×1 IMPLANT
GLOVE SURG LX STRL 7.5 STRW (GLOVE) ×2 IMPLANT
GOWN SRG XL LVL 4 BRTHBL STRL (GOWNS) ×1 IMPLANT
GOWN STRL NON-REIN XL LVL4 (GOWNS) ×1
GOWN STRL REUS W/ TWL XL LVL3 (GOWN DISPOSABLE) ×4 IMPLANT
GOWN STRL REUS W/TWL XL LVL3 (GOWN DISPOSABLE) ×4
HEMOSTAT SURGICEL 4X8 (HEMOSTASIS) ×1 IMPLANT
HOLDER FOLEY CATH W/STRAP (MISCELLANEOUS) ×1 IMPLANT
IRRIG SUCT STRYKERFLOW 2 WTIP (MISCELLANEOUS) ×1
IRRIGATION SUCT STRKRFLW 2 WTP (MISCELLANEOUS) ×1 IMPLANT
KIT BASIN OR (CUSTOM PROCEDURE TRAY) ×2 IMPLANT
KIT TURNOVER KIT A (KITS) IMPLANT
LOOP VESSEL MAXI BLUE (MISCELLANEOUS) ×1 IMPLANT
MARKER SKIN DUAL TIP RULER LAB (MISCELLANEOUS) ×2 IMPLANT
MESH 3DMAX 5X7 LT XLRG (Mesh General) IMPLANT
NDL HYPO 22X1.5 SAFETY MO (MISCELLANEOUS) ×2 IMPLANT
NDL INSUFFLATION 14GA 120MM (NEEDLE) ×1 IMPLANT
NEEDLE HYPO 22X1.5 SAFETY MO (MISCELLANEOUS) ×2 IMPLANT
NEEDLE INSUFFLATION 14GA 120MM (NEEDLE) ×1 IMPLANT
PACK BASIC VI WITH GOWN DISP (CUSTOM PROCEDURE TRAY) ×1 IMPLANT
PENCIL SMOKE EVACUATOR (MISCELLANEOUS) IMPLANT
PORT ACCESS TROCAR AIRSEAL 12 (TROCAR) ×1 IMPLANT
PROTECTOR NERVE ULNAR (MISCELLANEOUS) ×2 IMPLANT
RELOAD STAPLE 4.0 BLU F/HERNIA (INSTRUMENTS) IMPLANT
RELOAD STAPLE 45 2.6 WHT THIN (STAPLE) IMPLANT
RELOAD STAPLE HERNIA 4.0 BLUE (INSTRUMENTS) ×1 IMPLANT
SCISSORS LAP 5X45 EPIX DISP (ENDOMECHANICALS) IMPLANT
SCISSORS MNPLR CVD DVNC XI (INSTRUMENTS) ×1 IMPLANT
SEAL UNIV 5-12 XI (MISCELLANEOUS) ×3 IMPLANT
SET IRRIG Y TYPE TUR BLADDER L (SET/KITS/TRAYS/PACK) ×1 IMPLANT
SET TRI-LUMEN FLTR TB AIRSEAL (TUBING) ×1 IMPLANT
SET TUBE SMOKE EVAC HIGH FLOW (TUBING) IMPLANT
SOL ELECTROSURG ANTI STICK (MISCELLANEOUS) ×1
SOLUTION ELECTROSURG ANTI STCK (MISCELLANEOUS) ×1 IMPLANT
SPIKE FLUID TRANSFER (MISCELLANEOUS) ×1 IMPLANT
SPONGE T-LAP 4X18 ~~LOC~~+RFID (SPONGE) ×1 IMPLANT
STAPLE RELOAD 45 WHT (STAPLE) IMPLANT
STAPLE RELOAD 45MM WHITE (STAPLE)
STAPLER HERNIA 12 8.5 360D (INSTRUMENTS) IMPLANT
SURGIFLO W/THROMBIN 8M KIT (HEMOSTASIS) ×1 IMPLANT
SUT ETHILON 3 0 PS 1 (SUTURE) ×1 IMPLANT
SUT MNCRL AB 4-0 PS2 18 (SUTURE) ×3 IMPLANT
SUT PDS AB 1 CT1 27 (SUTURE) ×2 IMPLANT
SUT PROLENE 2 0 SH DA (SUTURE) ×1 IMPLANT
SUT SILK 0 (SUTURE)
SUT SILK 0 30XBRD TIE 6 (SUTURE) IMPLANT
SUT SILK 2 0 (SUTURE)
SUT SILK 2-0 18XBRD TIE 12 (SUTURE) IMPLANT
SUT V-LOC BARB 180 2/0GR6 GS22 (SUTURE) ×1
SUT VIC AB 0 CT1 27 (SUTURE) ×3
SUT VIC AB 0 CT1 27XBRD ANTBC (SUTURE) ×4 IMPLANT
SUT VIC AB 2-0 SH 27 (SUTURE) ×1
SUT VIC AB 2-0 SH 27X BRD (SUTURE) ×4 IMPLANT
SUT VIC AB 3-0 SH 27 (SUTURE)
SUT VIC AB 3-0 SH 27XBRD (SUTURE) ×1 IMPLANT
SUT VLOC BARB 180 ABS3/0GR12 (SUTURE) ×1
SUTURE V-LC BRB 180 2/0GR6GS22 (SUTURE) IMPLANT
SUTURE VLOC BRB 180 ABS3/0GR12 (SUTURE) ×1 IMPLANT
SYR BULB IRRIG 60ML STRL (SYRINGE) ×1 IMPLANT
SYR CONTROL 10ML LL (SYRINGE) ×1 IMPLANT
SYS BAG RETRIEVAL 10MM (BASKET) ×1
SYSTEM BAG RETRIEVAL 10MM (BASKET) ×1 IMPLANT
TOWEL OR 17X26 10 PK STRL BLUE (TOWEL DISPOSABLE) ×2 IMPLANT
TOWEL OR NON WOVEN STRL DISP B (DISPOSABLE) ×1 IMPLANT
TRAY FOLEY MTR SLVR 16FR STAT (SET/KITS/TRAYS/PACK) ×1 IMPLANT
TRAY LAPAROSCOPIC (CUSTOM PROCEDURE TRAY) ×1 IMPLANT
TROCAR OPTICAL SHORT 5MM (TROCAR) IMPLANT
TROCAR OPTICAL SLV SHORT 5MM (TROCAR) IMPLANT
TROCAR Z THREAD OPTICAL 12X100 (TROCAR) ×1 IMPLANT
TROCAR Z-THREAD OPTICAL 5X100M (TROCAR) IMPLANT
WATER STERILE IRR 1000ML POUR (IV SOLUTION) ×2 IMPLANT

## 2023-01-08 NOTE — Transfer of Care (Signed)
Immediate Anesthesia Transfer of Care Note  Patient: Brad Stewart  Procedure(s) Performed: XI ROBOTIC ASSITED PARTIAL NEPHRECTOMY AND CYST DECORTICATION (Left) HERNIA REPAIR INGUINAL ADULT WITH MESH (Left)  Patient Location: PACU  Anesthesia Type:General  Level of Consciousness: awake, alert , and oriented  Airway & Oxygen Therapy: Patient Spontanous Breathing and Patient connected to face mask oxygen  Post-op Assessment: Report given to RN, Post -op Vital signs reviewed and stable, and Patient moving all extremities X 4  Post vital signs: Reviewed and stable  Last Vitals:  Vitals Value Taken Time  BP 138/84 01/08/23 1215  Temp    Pulse 73 01/08/23 1221  Resp 15 01/08/23 1221  SpO2 92 % 01/08/23 1221  Vitals shown include unvalidated device data.  Last Pain:  Vitals:   01/08/23 0735  TempSrc:   PainSc: 0-No pain      Patients Stated Pain Goal: 3 (01/08/23 0735)  Complications: No notable events documented.

## 2023-01-08 NOTE — H&P (Signed)
Brad Stewart is an 62 y.o. male.    Pre-OP LEFT Robotic Partial Nephrectomy / Cyst Decortication  HPI:   1 - Small Left Renal Mass + Left Renal Cyst - 2cm left mid anterior solid enhancing mass incidental on MRI and confirmed on dedicated CT 10/2022. 60% exophytic. 1 artery (early branchign) / 1 vein left renovascular anatomy. Also posterior med small cyst but with some complex featrues (wall thickening by my interpretation). ??  2 - Left Inguinal Hernia - prior open repair then some recurrence of small left inguinal hernia. NO h/o strangulaiton. Had opinion from A. Derrell Lolling who offered lap mesh based repair. ?  ?PMH sig for borderline DM2 (A1c 6), shoulder surgery. No CV disease / blood thinners. 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 Significatn other Melissa is involved. His PCP is Jefm Bryant MD. ?  ?Today " Brad Stewart" is seen to proceed with LEFT robotic partial nephrectomy / cyst decortication. No interval fevers. Hgb 12.8    Past Medical History:  Diagnosis Date   COVID-19 09/2019   History of kidney stones    Hypothyroidism    PONV (postoperative nausea and vomiting)     Past Surgical History:  Procedure Laterality Date   INGUINAL HERNIA REPAIR Left 06/23/2020   Procedure: OPEN REPAIR LEFT INGUINAL HERNIA WITH MESH;  Surgeon: Darnell Level, MD;  Location: [facility];  Service: General;  Laterality: Left;   SHOULDER ARTHROSCOPY Left    SHOULDER ARTHROSCOPY Right    UMBILICAL HERNIA REPAIR N/A 06/23/2020   Procedure: OPEN REPAIR UMBILICAL HERNIA WITH MESH;  Surgeon: Darnell Level, MD;  Location: [facility];  Service: General;  Laterality: N/A;    Family History  Problem Relation Age of Onset   Coronary artery disease Father 63       dx age 33, had CABG   Bladder Cancer Father    Diabetes Other        grandmother, F (late onset)   Colon cancer Neg Hx    Prostate cancer Neg Hx    Stroke Neg Hx    Esophageal  cancer Neg Hx    Rectal cancer Neg Hx    Stomach cancer Neg Hx    Social History:  reports that he has never smoked. He has never used smokeless tobacco. He reports current alcohol use. He reports that he does not use drugs.  Allergies:  Allergies  Allergen Reactions   Sulfonamide Derivatives Nausea And Vomiting    No medications prior to admission.    No results found for this or any previous visit (from the past 48 hour(s)). No results found.  Review of Systems  Constitutional:  Negative for chills and fever.  All other systems reviewed and are negative.   There were no vitals taken for this visit. Physical Exam Vitals reviewed.  HENT:     Head: Normocephalic.     Nose: Nose normal.  Eyes:     Pupils: Pupils are equal, round, and reactive to light.  Cardiovascular:     Rate and Rhythm: Normal rate.  Abdominal:     General: Abdomen is flat.  Genitourinary:    Comments: No CVAT. Stable left inguinal scar.  Musculoskeletal:     Cervical back: Normal range of motion.  Skin:    General: Skin is warm.  Neurological:     General: No focal deficit present.     Mental Status: He is alert.  Psychiatric:  Mood and Affect: Mood normal.      Assessment/Plan  Proceed as planned with LEFT robotic partial nephrectomy / cyst decorticationadn left inguinal hernia repair. Risks, benefits, alternatives, expected peri-op course discussed previously and reiterated today.   Loletta Parish., MD 01/08/2023, 5:24 AM

## 2023-01-08 NOTE — H&P (Signed)
Brad Stewart is an 62 y.o. male.   Chief Complaint: L inguinal hernia HPI: Pt with prev L open IHR with mesh. Pt with recurrence to left side and some pain. Pt was counseled and decided to have concurrent robo vs lap LIHR with mesh  Past Medical History:  Diagnosis Date   COVID-19 09/2019   History of kidney stones    Hypothyroidism    PONV (postoperative nausea and vomiting)     Past Surgical History:  Procedure Laterality Date   INGUINAL HERNIA REPAIR Left 06/23/2020   Procedure: OPEN REPAIR LEFT INGUINAL HERNIA WITH MESH;  Surgeon: Darnell Level, MD;  Location: [facility];  Service: General;  Laterality: Left;   SHOULDER ARTHROSCOPY Left    SHOULDER ARTHROSCOPY Right    UMBILICAL HERNIA REPAIR N/A 06/23/2020   Procedure: OPEN REPAIR UMBILICAL HERNIA WITH MESH;  Surgeon: Darnell Level, MD;  Location: [facility];  Service: General;  Laterality: N/A;    Family History  Problem Relation Age of Onset   Coronary artery disease Father 76       dx age 54, had CABG   Bladder Cancer Father    Diabetes Other        grandmother, F (late onset)   Colon cancer Neg Hx    Prostate cancer Neg Hx    Stroke Neg Hx    Esophageal cancer Neg Hx    Rectal cancer Neg Hx    Stomach cancer Neg Hx    Social History:  reports that he has never smoked. He has never used smokeless tobacco. He reports current alcohol use. He reports that he does not use drugs.  Allergies:  Allergies  Allergen Reactions   Sulfonamide Derivatives Nausea And Vomiting    Medications Prior to Admission  Medication Sig Dispense Refill   cholecalciferol (VITAMIN D3) 25 MCG (1000 UT) tablet Take 1,000 Units by mouth daily.     levothyroxine (SYNTHROID) 100 MCG tablet Take 2 tablets (200 mcg total) by mouth daily before breakfast. 180 tablet 1   Multiple Vitamin (MULTIVITAMIN) capsule Take 1 capsule by mouth daily.      No results found for this or any previous visit (from the past 48 hour(s)). No results  found.  Review of Systems  Constitutional:  Negative for chills and fever.  HENT:  Negative for ear discharge, hearing loss and sore throat.   Eyes:  Negative for discharge.  Respiratory:  Negative for cough and shortness of breath.   Cardiovascular:  Negative for chest pain and leg swelling.  Gastrointestinal:  Negative for abdominal pain, constipation, diarrhea, nausea and vomiting.  Musculoskeletal:  Negative for myalgias and neck pain.  Skin:  Negative for rash.  Allergic/Immunologic: Negative for environmental allergies.  Neurological:  Negative for dizziness and seizures.  Hematological:  Does not bruise/bleed easily.  Psychiatric/Behavioral:  Negative for suicidal ideas.   All other systems reviewed and are negative.   Blood pressure 126/87, pulse 68, temperature 98.4 F (36.9 C), temperature source Oral, resp. rate 16, height 6' (1.829 m), weight 103 kg, SpO2 93 %. Physical Exam Constitutional:      Appearance: He is well-developed.     Comments: Conversant No acute distress  HENT:     Head: Normocephalic and atraumatic.  Eyes:     General: Lids are normal. No scleral icterus.    Pupils: Pupils are equal, round, and reactive to light.     Comments: Pupils are equal round and reactive No lid lag Moist conjunctiva  Neck:     Thyroid: No thyromegaly.     Trachea: No tracheal tenderness.     Comments: No cervical lymphadenopathy Cardiovascular:     Rate and Rhythm: Normal rate and regular rhythm.     Heart sounds: No murmur heard. Pulmonary:     Effort: Pulmonary effort is normal.     Breath sounds: Normal breath sounds. No wheezing or rales.  Abdominal:     Tenderness: There is no abdominal tenderness.     Hernia: A hernia is present. Hernia is present in the left inguinal area.  Musculoskeletal:     Cervical back: Normal range of motion and neck supple.  Skin:    General: Skin is warm.     Findings: No rash.     Nails: There is no clubbing.     Comments:  Normal skin turgor  Neurological:     Mental Status: He is alert and oriented to person, place, and time.     Comments: Normal gait and station  Psychiatric:        Mood and Affect: Mood normal.        Thought Content: Thought content normal.        Judgment: Judgment normal.     Comments: Appropriate affect      Assessment/Plan Recurrent LIH To OR for lap vs robo LIHR with mesh All risks and benefits were discussed with the patient, to generally include infection, bleeding, damage to surrounding structures, acute and chronic nerve pain, and recurrence. Alternatives were offered and described.  All questions were answered and the patient voiced understanding of the procedure and wishes to proceed at this point.   Axel Filler, MD 01/08/2023, 8:07 AM

## 2023-01-08 NOTE — Discharge Instructions (Addendum)
1- Drain Sites - You may have some mild persistent drainage from old drain site for several days, this is normal. This can be covered with cotton gauze for convenience.  2 - Stiches - Your stitches are all dissolvable. You may notice a "loose thread" at your incisions, these are normal and require no intervention. You may cut them flush to the skin with fingernail clippers if needed for comfort.  3 - Diet - No restrictions  4 - Activity - No heavy lifting / straining (any activities that require valsalva or "bearing down") x 4 weeks. Otherwise, no restrictions.  5 - Bathing - You may shower immediately. Do not take a bath or get into swimming pool where incision sites are submersed in water x 4 weeks.   6 -  When to Call the Doctor - Call MD for any fever >102, any acute wound problems, or any severe nausea / vomiting. You can call the [facility] [phone]) 24 hours a day 365 days a year. It will roll-over to the answering service and on-call physician after hours.    You may resume aspirin, advil, aleve, vitamins, and supplements 7 days after surgery.     [facility] _______[facility]  INGUINAL HERNIA REPAIR: POST OP INSTRUCTIONS  Always review your discharge instruction sheet given to you by the facility where your surgery was performed. IF YOU HAVE DISABILITY OR FAMILY LEAVE FORMS, YOU MUST BRING THEM TO THE OFFICE FOR PROCESSING.   DO NOT GIVE THEM TO YOUR DOCTOR.  1. A  prescription for pain medication may be given to you upon discharge.  Take your pain medication as prescribed, if needed.  If narcotic pain medicine is not needed, then you may take acetaminophen (Tylenol) or ibuprofen (Advil) as needed. 2. Take your usually prescribed medications unless otherwise directed. If you need a refill on your pain medication, please contact your pharmacy.  They will contact our office to request authorization. Prescriptions will not be filled after 5 pm or on  week-ends. 3. You should follow a light diet the first 24 hours after arrival home, such as soup and crackers, etc.  Be sure to include lots of fluids daily.  Resume your normal diet the day after surgery. 4.Most patients will experience some swelling and bruising around the umbilicus or in the groin and scrotum.  Ice packs and reclining will help.  Swelling and bruising can take several days to resolve.  6. It is common to experience some constipation if taking pain medication after surgery.  Increasing fluid intake and taking a stool softener (such as Colace) will usually help or prevent this problem from occurring.  A mild laxative (Milk of Magnesia or Miralax) should be taken according to package directions if there are no bowel movements after 48 hours. 7. Unless discharge instructions indicate otherwise, you may remove your bandages 24-48 hours after surgery, and you may shower at that time.  You may have steri-strips (small skin tapes) in place directly over the incision.  These strips should be left on the skin for 7-10 days.  If your surgeon used skin glue on the incision, you may shower in 24 hours.  The glue will flake off over the next 2-3 weeks.  Any sutures or staples will be removed at the office during your follow-up visit. 8. ACTIVITIES:  You may resume regular (light) daily activities beginning the next day--such as daily self-care, walking, climbing stairs--gradually increasing activities as tolerated.  You may have sexual  intercourse when it is comfortable.  Refrain from any heavy lifting or straining until approved by your doctor.  a.You may drive when you are no longer taking prescription pain medication, you can comfortably wear a seatbelt, and you can safely maneuver your car and apply brakes. b.RETURN TO WORK:   _____________________________________________  9.You should see your doctor in the office for a follow-up appointment approximately 2-3 weeks after your surgery.  Make  sure that you call for this appointment within a day or two after you arrive home to insure a convenient appointment time. 10.OTHER INSTRUCTIONS: _________________________    _____________________________________  WHEN TO CALL YOUR DOCTOR: Fever over 101.0 Inability to urinate Nausea and/or vomiting Extreme swelling or bruising Continued bleeding from incision. Increased pain, redness, or drainage from the incision  The clinic staff is available to answer your questions during regular business hours.  Please don't hesitate to call and ask to speak to one of the nurses for clinical concerns.  If you have a medical emergency, go to the nearest emergency room or call 911.  A surgeon from [facility] is always on call at the hospital   [address], [address], [place], [place]  [address] ?  [address] [address], [place], [place]   [address] [phone] ? [phone] ? FAX [phone] Web site: [email or web address]

## 2023-01-08 NOTE — Op Note (Signed)
01/08/2023  11:36 AM  PATIENT:  Brad Stewart  62 y.o. male  PRE-OPERATIVE DIAGNOSIS:  LEFT RENAL MASS AND CYST RECCURENT INGUINAL HERNIA  POST-OPERATIVE DIAGNOSIS:  LEFT RENAL MASS AND CYST RECCURENT INGUINAL HERNIA  PROCEDURE:  Procedure(s) with comments: Lap right inguinal HERNIA REPAIR INGUINAL ADULT WITH MESH (Left)  SURGEON:  Surgeon(s) and Role:    Axel Filler, MD - Primary  ASSISTANTS: none   ANESTHESIA:   local and general  EBL:  25 mL   BLOOD ADMINISTERED:none  DRAINS: none   LOCAL MEDICATIONS USED:  BUPIVICAINE   SPECIMEN:  No Specimen  DISPOSITION OF SPECIMEN:  N/A  COUNTS:  YES  TOURNIQUET:  * No tourniquets in log *  DICTATION: .Dragon Dictation  Counts: reported as correct x 2  Findings:  The patient had a small right indirect hernia  Indications for procedure:  The patient is a 62 year old male with a recurrent right inguinal hernia for several months. Patient complained of symptomatology to his right inguinal area. The patient was taken back for elective inguinal hernia repair.  Details of the procedure: The patient was in the operating room per Dr. Berneice Heinrich.  Please see op note for his detaisl.  The patient was prepped and draped in the usual sterile fashion.  After appropriate anitbiotics were confirmed, a time-out was confirmed and all facts were verified.  0.25% Marcaine was used to infiltrate the umbilical area. A 11-blade was used to cut down the skin and blunt dissection was used to get the anterior fashion.  The anterior fascia was incised approximately 1 cm and the muscles were retracted laterally. Blunt dissection was then used to create a space in the preperitoneal area. At this time a 10 mm camera was then introduced into the space and advanced the pubic tubercle and a 12 mm trocar was placed over this and insufflation was started.  At this time and space was created from medial to laterally the preperitoneal space.  Cooper's ligament  was initially cleaned off.  The hernia sac was identified in the indirect space. Dissection of the hernia sac and cord structures was undertaken the vas deferens was identified and protected in all parts of the case.   Once the hernia sac was taken down to approximately the umbilicus a Bard 3D Max mesh, size: Barney Drain, was  introduced into the preperitoneal space.  The mesh was brought over to cover the direct and indirect hernia spaces.  This was anchored into place and secured to Cooper's ligament with 4.58mm staples from a [place] hernia stapler. It was anchored to the anterior abdominal wall with 4.8 mm staples. The hernia sac was seen lying posterior to the mesh. There was no staples placed laterally. The insufflation was evacuated and the peritoneum was seen posterior to the mesh. The trochars were removed. The anterior fascia was reapproximated using #1 Vicryl on a UR- 6.  Intra-abdominal air was evacuated and the Veress needle removed. The skin was reapproximated using 4-0 Monocryl subcuticular fashion and Dermabond. The patient was awakened from general anesthesia and taken to recovery in stable condition.    PLAN OF CARE: Admit for overnight observation  PATIENT DISPOSITION:  PACU - hemodynamically stable.   Delay start of Pharmacological VTE agent (>24hrs) due to surgical blood loss or risk of bleeding: not applicable

## 2023-01-08 NOTE — Op Note (Signed)
NAMEHARSHIV, MUSGRAVES MEDICAL RECORD NO: [id number] ACCOUNT NO: [id number] DATE OF BIRTH: 06-26-1961 FACILITY: [facility] LOCATION: [facility] PHYSICIAN: Sebastian Ache, MD  Operative Report   DATE OF PROCEDURE: 01/08/2023  PREOPERATIVE DIAGNOSES:   1.  Solid left renal mass. 2.  Small left renal cyst. 3.  Left recurrent inguinal hernia.  PROCEDURES:   1.  Robotic-assisted laparoscopic left partial nephrectomy with intraoperative ultrasound. 2.  Laparoscopic left renal cyst decortication.  ESTIMATED BLOOD LOSS:  25 mL.  COMPLICATIONS:  None.  SPECIMENS:   1.  Left renal cyst wall for permanent pathology. 2.  left renal mass for permanent pathology.  FINDINGS:   1.  Single early branching artery, single vein left renal vascular anatomy. 2.  Approximately 70% exophytic left anterior renal mass, solid. 3.  Noncomplex appearing left renal cyst with ultrasound.  DRAINS: 1.  Jackson-Pratt drain to bulb suction. 2.  Foley catheter to straight drain.  INDICATIONS:  The patient is a very pleasant 62 year old man who was found initially to have a solid left enhancing renal mass on axial imaging and this was concerning for localized stage I renal cell carcinoma.  There was also a very small ipsilateral  renal cyst with some wall thickening questionable early enhancement on imaging as well. He has recurrent left inguinal hernia.  Also, he wishes to have this fixed by our general surgery colleague, Dr. Derrell Lolling.  Options were discussed for overall  management, we agreed upon path of left partial nephrectomy for addressing his renal mass and cyst and combined second team inguinal hernia repair by Dr. Derrell Lolling to perform these with single anesthetic and he wished to proceed.  Informed consent was obtained  and placed in medical record.  PROCEDURE IN DETAIL:  The patient being identified and verified, procedure being left robotic partial nephrectomy with cyst decortication was confirmed.   Procedure timeout was performed.  Intravenous antibiotics were administered.  General endotracheal  anesthesia induced.  The patient was placed in the left side up full anesthesia and pulling 15 degrees of table flexion, superior arm elevator, axillary roll, sequential compression devices, bottom leg bent, top leg straight.  Foley catheter was placed  per urethra to straight drain.  He was further fashioned to the OR table using 3-inch tape over foam padding across supraxiphoid chest and his pelvis.  He was clipper shaven and then sterile field was created, prepped and draped the patient's entire left  flank and abdomen using chlorhexidine gluconate and a high flow, low pressure, pneumoperitoneum was obtained using Veress technique on the left lower quadrant, having passed the aspiration and drop test.  Next, an 8 mm robotic camera port was placed in  position approximately one handbreadth superior lateral to the umbilicus.  Laparoscopic examination peritoneal cavity demonstrated no significant adhesions, no visceral injury.  Additional ports were placed as follows:  Left subcostal 8 mm robotic port,  left far lateral 8 mm robotic port, approximately 4 fingerbreadths superomedial to the anterior iliac spine, left paramedian inferior robotic port, approximately 1/2 handbreadth superior to pubic ramus and two 12 mm assistant port sites to midline, one  approximately 2 fingerbreadths superior to the planned camera port AirSeal type and one approximately 2 fingerbreadths inferior to the plane of the camera port.  Robot was docked and passed the electronic checks.  Initial attention was directed at  development of retroperitoneum. It was made lateral to the descending colon from the area of the splenic flexure towards the area of the internal ring and  the colon was carefully swept medially.  Lower pole kidney area was identified, placed on gentle  lateral traction.  Dissection proceeded medial to this.  Ureter  and gonadal vessels were encountered and also placed on gentle lateral traction.  Dissection proceeded within this triangle towards the area of the renal hilum. To better allow for hilum  dissection lateral splenic attachments were taken down, allowing the spleen and pancreas to rotate medially on soft retraction.  This provided a better window to the area of the hilum.  Hilum consisted of single early branching artery, single vein renal  vascular anatomy as anticipated. Artery circumferentially mobilized marked vessel loop.  Attention was directed at the identification of the renal mass.  Renal mass was encountered as anticipated just below the plane of the hilar access and the anterior  portion of the kidney.  This was solid and certainly neoplastic appearing grossly.  This was dissected for a distance of approximately 2 cm in all directions beyond its obvious parenchymal interface, however this was clearly identified appeared amenable  to partial nephrectomy.  The kidney was further dissected posteriorly and the small renal cyst in question was found.  It appeared to be actually quite simple grossly and intraoperative ultrasound was performed of both lesions.  Intraoperative ultrasound of the posterior cyst revealed a very simple-appearing cyst, it was quite small, less than a centimeter.  Interrogation of the mass revealed a solid heterogeneous mass approximately 70% exophytic.  Using a combination of visual  and ultrasound cues the plane for partial nephrectomy was scored on the kidney around the renal mass.  Next, a small cyst was purposely incised with a sample cyst wall set aside for permanent pathology and the base of this was completely fulgurated to  ablate the remainder of the cyst cavity.  Warm ischemia was then achieved by placing 2 bulldog clamps on the renal artery and partial nephrectomy was performed using cold scissors keeping what appeared to be a rim of normal renal parenchyma with the   partial nephrectomy specimen was then placed in EndoCatch bag for later retrieval.  First layer renorrhaphy was performed using running 3-0 V-Loc oversewing several small venous sinuses, point coagulation current was used.  A small arterials a half  length Surgicel bolster was then placed in 2 parenchymal apposition sutures of 0 Vicryl sandwiched between Hem-o-loks and Lapra-Tys resulted in excellent hemostasis.  Bulldog clamps were removed.  The vessel loop was removed.  It was reapproximated using  2-0 V-Loc.  The superior most 3 port sites including the subcostal robotic camera and superior assistant port site were then all closed with the 12 mm site was closed at the level of fascia using Carter-Thomason suture passer and 0 Vicryl.  Ioban tight  dressing was placed over the inferior ports.  The patient was repositioned into a supine position.  Sterile field was created incorporating the enclosed port sites.  Dr. Derrell Lolling then performed preperitoneal laparoscopic left inguinal hernia repair as per  separate operative note.  After he finished his portion the abdomen was once again insufflated intraperitoneally under laparoscopic vision a closed suction drain was brought through the paramedian robotic port site.  Drain stitch was applied.  Specimen was retrieved by extending the previous inferior most assistant port site, it was approximately 15 mm.  Removing the partial nephrectomy specimen set aside for permanent pathology.  This closed at the level of fascia using figure-of-eight PDS x2 followed by  reapproximation of Scarpa's with running Vicryl.  All  incision sites were infiltrated with dilute lipolyzed Marcaine.  Procedure was terminated.  The patient tolerated the procedure well, no immediate perioperative complications.  The patient was taken  to postanesthesia care in stable condition.  Please note, first assistant, Harrie Foreman, was crucial for all portions of surgery today.  She provided  invaluable retraction, suctioning, vascular clamping, specimen manipulation, robotic instrument exchange and general first assistance.   PUS D: 01/08/2023 12:30:26 pm T: 01/08/2023 1:34:00 pm  JOB: [id number]/ [id number]

## 2023-01-08 NOTE — Anesthesia Procedure Notes (Signed)
Procedure Name: Intubation Date/Time: 01/08/2023 8:24 AM  Performed by: Doran Clay, CRNAPre-anesthesia Checklist: Patient identified, Emergency Drugs available, Suction available, Patient being monitored and Timeout performed Patient Re-evaluated:Patient Re-evaluated prior to induction Oxygen Delivery Method: Circle system utilized Preoxygenation: Pre-oxygenation with 100% oxygen Induction Type: IV induction Ventilation: Mask ventilation without difficulty Laryngoscope Size: Mac and 4 Grade View: Grade I Tube type: Oral Tube size: 7.5 mm Number of attempts: 1 Airway Equipment and Method: Stylet Placement Confirmation: ETT inserted through vocal cords under direct vision, positive ETCO2 and breath sounds checked- equal and bilateral Secured at: 23 cm Tube secured with: Tape Dental Injury: Teeth and Oropharynx as per pre-operative assessment

## 2023-01-08 NOTE — Brief Op Note (Signed)
01/08/2023  12:20 PM  PATIENT:  Brad Stewart  62 y.o. male  PRE-OPERATIVE DIAGNOSIS:  LEFT RENAL MASS AND CYST RECCURENT INGUINAL HERNIA  POST-OPERATIVE DIAGNOSIS:  LEFT RENAL MASS AND CYST RECCURENT INGUINAL HERNIA  PROCEDURE:  LEFT partial nephrectomy with Intraoperative Ultrasound nd cyst decortication  SURGEON:  Surgeon(s) and Role: Panel 1:    * Ruey Storer, Delbert Phenix., MD - Primary  PHYSICIAN ASSISTANT:   ASSISTANTS: amanda dancy PA   ANESTHESIA:   local and general  EBL:  50 mL   BLOOD ADMINISTERED:none  DRAINS:  1- JP to bulb; 2- Foley to gravity    LOCAL MEDICATIONS USED:  MARCAINE     SPECIMEN:  Source of Specimen:  left renal cyst wall, left renal mass  DISPOSITION OF SPECIMEN:  PATHOLOGY  COUNTS:  YES  TOURNIQUET:  * No tourniquets in log *  DICTATION: .Other Dictation: Dictation Number [id number]  PLAN OF CARE: Admit for overnight observation  PATIENT DISPOSITION:  PACU - hemodynamically stable.   Delay start of Pharmacological VTE agent (>24hrs) due to surgical blood loss or risk of bleeding: yes

## 2023-01-08 NOTE — Anesthesia Postprocedure Evaluation (Signed)
Anesthesia Post Note  Patient: Brad Stewart  Procedure(s) Performed: XI ROBOTIC ASSITED PARTIAL NEPHRECTOMY AND CYST DECORTICATION (Left) HERNIA REPAIR INGUINAL ADULT WITH MESH (Left)     Patient location during evaluation: PACU Anesthesia Type: General Level of consciousness: sedated Pain management: pain level controlled Vital Signs Assessment: post-procedure vital signs reviewed and stable Respiratory status: spontaneous breathing and respiratory function stable Cardiovascular status: stable Postop Assessment: no apparent nausea or vomiting Anesthetic complications: no  No notable events documented.  Last Vitals:  Vitals:   01/08/23 1315 01/08/23 1330  BP: (!) 144/88 (!) 147/82  Pulse: (!) 58 60  Resp: 14 13  Temp:  (!) 36.4 C  SpO2: 98% 96%    Last Pain:  Vitals:   01/08/23 1330  TempSrc:   PainSc: 4                  Belvie Iribe DANIEL

## 2023-01-09 LAB — BASIC METABOLIC PANEL
Anion gap: 5 (ref 5–15)
BUN: 17 mg/dL (ref 8–23)
CO2: 25 mmol/L (ref 22–32)
Calcium: 8.2 mg/dL — ABNORMAL LOW (ref 8.9–10.3)
Chloride: 107 mmol/L (ref 98–111)
Creatinine, Ser: 1.21 mg/dL (ref 0.61–1.24)
GFR, Estimated: >60 mL/min (ref >60)
Glucose, Bld: 112 mg/dL — ABNORMAL HIGH (ref 70–99)
Potassium: 4.8 mmol/L (ref 3.5–5.1)
Sodium: 137 mmol/L (ref 135–145)

## 2023-01-09 LAB — HEMOGLOBIN AND HEMATOCRIT, BLOOD
HCT: 37.3 % — ABNORMAL LOW (ref 39.0–52.0)
Hemoglobin: 11.9 g/dL — ABNORMAL LOW (ref 13.0–17.0)

## 2023-01-09 NOTE — Discharge Summary (Signed)
Date of admission: 01/08/2023  Date of discharge: 01/09/2023  Admission diagnosis: left renal mass, left inguinal hernia  Discharge diagnosis: same  Secondary diagnoses: none  History and Physical: For full details, please see admission history and physical. Briefly, Brad Stewart is a 62 y.o. year old patient with a PMH of left renal mass and left renal cyst as well as left inguinal hernia.   Hospital Course: Patient presented for a scheduled robotic assisted left partial nephrectomy and cyst decortication and laparoscopic left inguinal hernia repair with mesh with Dr. Berneice Heinrich and Dr. Derrell Lolling. The patient tolerated the procedure well and without complication. He was transferred to the floor for routine post-operative care. By the morning of POD#1, patient's pain was well controlled and he was tolerating a regular diet. His catheter was removed and he passed a TOV. JP drain had 15cc of output overnight and was discontinued on POD#1. He was ambulating and performing ADLs without difficulty. By the afternoon of POD#1, patient was deemed appropriate for discharge.  Laboratory values:  Recent Labs    01/08/23 1239 01/09/23 0442  HGB 12.2* 11.9*  HCT 38.7* 37.3*   Recent Labs    01/09/23 0442  CREATININE 1.21    Disposition: Home  Discharge instruction: The patient was instructed to be ambulatory but told to refrain from heavy lifting, strenuous activity, or driving.   Discharge medications:  Allergies as of 01/09/2023       Reactions   Sulfonamide Derivatives Nausea And Vomiting        Medication List     STOP taking these medications    cholecalciferol 25 MCG (1000 UNIT) tablet Commonly known as: VITAMIN D3   multivitamin capsule       TAKE these medications    docusate sodium 100 MG capsule Commonly known as: COLACE Take 1 capsule (100 mg total) by mouth 2 (two) times daily.   HYDROcodone-acetaminophen 5-325 MG tablet Commonly known as: Norco Take 1-2 tablets  by mouth every 6 (six) hours as needed for moderate pain or severe pain.   levothyroxine 100 MCG tablet Commonly known as: SYNTHROID Take 2 tablets (200 mcg total) by mouth daily before breakfast.        Followup:   Follow-up Information     Sebastian Ache, MD Follow up on 01/23/2023.   Specialty: Urology Why: T 11 AM for MD visit and pathology review. Contact information: [address] [place] [address] [phone]         Axel Filler, MD. Schedule an appointment as soon as possible for a visit in 2 week(s).   Specialty: General Surgery Why: Post op visit Contact information: [address] [place] [address] [phone]

## 2023-01-10 ENCOUNTER — Telehealth: Payer: Self-pay | Admitting: *Deleted

## 2023-01-10 ENCOUNTER — Encounter (HOSPITAL_COMMUNITY): Payer: Self-pay | Admitting: Urology

## 2023-01-10 NOTE — Transitions of Care (Post Inpatient/ED Visit) (Signed)
01/10/2023  Name: Brad Stewart MRN: [id number] DOB: September 22, 1960  Today's TOC FU Call Status: Today's TOC FU Call Status:: Successful TOC FU Call Competed TOC FU Call Complete Date: 01/10/23  Transition Care Management Follow-up Telephone Call Date of Discharge: 01/09/23 Discharge Facility: [facility]) Type of Discharge: Inpatient Admission Primary Inpatient Discharge Diagnosis:: partial nephrectomy with cyst decortication secondary to renal mass How have you been since you were released from the hospital?: Better ("I am doing okay, my sons are here looking out for me.  No problem with pain-- just soreness; I am up and about and just taking my time") Any questions or concerns?: No  Items Reviewed: Did you receive and understand the discharge instructions provided?: Yes (thoroughly reviewed with patient who verbalizes good understanding of same) Medications obtained,verified, and reconciled?: Yes (Medications Reviewed) (Full medication reconciliation/ review completed; no concerns or discrepancies identified; confirmed patient obtained/ is taking all newly Rx'd medications as instructed; self-manages medications and denies questions/ concerns around medications today) Any new allergies since your discharge?: No Dietary orders reviewed?: Yes Type of Diet Ordered:: "Regular" Do you have support at home?: Yes People in Home: alone Name of Support/Comfort Primary Source: Reports normally lives alone/ independent in self-care activities; adult sons temporarily staying with patient after recent surgery-- assists as/ if needed/ indicated  Medications Reviewed Today: Medications Reviewed Today     Reviewed by Michaela Corner, RN (Registered Nurse) on 01/10/23 at 1159  Med List Status: <None>   Medication Order Taking? Sig Documenting Provider Last Dose Status Informant  docusate sodium (COLACE) 100 MG capsule [id number]  Take 1 capsule (100 mg total) by mouth 2 (two) times daily.  Harrie Foreman, PA-C  Active   HYDROcodone-acetaminophen (NORCO) 5-325 MG tablet [id number]  Take 1-2 tablets by mouth every 6 (six) hours as needed for moderate pain or severe pain. Harrie Foreman, PA-C  Active   levothyroxine (SYNTHROID) 100 MCG tablet [id number]  Take 2 tablets (200 mcg total) by mouth daily before breakfast. Wanda Plump, MD  Active Self            Home Care and Equipment/Supplies: Were Home Health Services Ordered?: No Any new equipment or medical supplies ordered?: No  Functional Questionnaire: Do you need assistance with bathing/showering or dressing?: No Do you need assistance with meal preparation?: No Do you need assistance with eating?: No Do you have difficulty maintaining continence: No Do you need assistance with getting out of bed/getting out of a chair/moving?: No Do you have difficulty managing or taking your medications?: No  Follow up appointments reviewed: PCP Follow-up appointment confirmed?: NA (verified not indicated per hospital discharging provider discharge notes) Specialist Hospital Follow-up appointment confirmed?: Yes Date of Specialist follow-up appointment?: 01/23/23 Follow-Up Specialty Provider:: [facility] provider Do you need transportation to your follow-up appointment?: No Do you understand care options if your condition(s) worsen?: Yes-patient verbalized understanding  SDOH Interventions Today    Flowsheet Row Most Recent Value  SDOH Interventions   Food Insecurity Interventions Intervention Not Indicated  Transportation Interventions Intervention Not Indicated  [drives self]      TOC Interventions Today    Flowsheet Row Most Recent Value  TOC Interventions   TOC Interventions Discussed/Reviewed TOC Interventions Discussed  [Patient declines need for ongoing/ further care coordination outreach,  no care coordination needs identified at time of TOC call today,  provided my direct contact information should questions/  concerns/ needs arise post-TOC call]      Interventions Today  Flowsheet Row Most Recent Value  Chronic Disease   Chronic disease during today's visit Other  [partial nephrectomy]  General Interventions   General Interventions Discussed/Reviewed General Interventions Discussed, Doctor Visits  Doctor Visits Discussed/Reviewed Specialist, Doctor Visits Discussed  PCP/Specialist Visits Compliance with follow-up visit  Education Interventions   Education Provided Provided Education  Provided Verbal Education On Medication  [side effects of narcotics]  Nutrition Interventions   Nutrition Discussed/Reviewed Nutrition Discussed  Pharmacy Interventions   Pharmacy Dicussed/Reviewed Pharmacy Topics Discussed  [Full medication review with updating medication list in EHR per patient report]      Caryl Pina, RN, BSN, CCRN Alumnus RN CM Care Coordination/ Transition of Care- [facility] Care Management [phone]: direct office

## 2023-01-13 LAB — SURGICAL PATHOLOGY

## 2023-01-14 ENCOUNTER — Other Ambulatory Visit: Payer: Self-pay | Admitting: Internal Medicine

## 2023-01-23 DIAGNOSIS — C641 Malignant neoplasm of right kidney, except renal pelvis: Secondary | ICD-10-CM | POA: Diagnosis not present

## 2023-03-13 DIAGNOSIS — L821 Other seborrheic keratosis: Secondary | ICD-10-CM | POA: Diagnosis not present

## 2023-03-13 DIAGNOSIS — L905 Scar conditions and fibrosis of skin: Secondary | ICD-10-CM | POA: Diagnosis not present

## 2023-03-13 DIAGNOSIS — D225 Melanocytic nevi of trunk: Secondary | ICD-10-CM | POA: Diagnosis not present

## 2023-03-13 DIAGNOSIS — D2262 Melanocytic nevi of left upper limb, including shoulder: Secondary | ICD-10-CM | POA: Diagnosis not present

## 2023-06-23 ENCOUNTER — Ambulatory Visit (HOSPITAL_COMMUNITY)
Admission: RE | Admit: 2023-06-23 | Discharge: 2023-06-23 | Disposition: A | Discharge status: Home / Self Care | Payer: BC Managed Care – PPO | Source: Ambulatory Visit | Attending: Urology | Admitting: Urology

## 2023-06-23 ENCOUNTER — Other Ambulatory Visit (HOSPITAL_COMMUNITY): Payer: Self-pay | Admitting: Urology

## 2023-06-23 DIAGNOSIS — C641 Malignant neoplasm of right kidney, except renal pelvis: Secondary | ICD-10-CM | POA: Diagnosis not present

## 2023-06-23 DIAGNOSIS — R9389 Abnormal findings on diagnostic imaging of other specified body structures: Secondary | ICD-10-CM | POA: Diagnosis not present

## 2023-06-23 DIAGNOSIS — Z125 Encounter for screening for malignant neoplasm of prostate: Secondary | ICD-10-CM | POA: Diagnosis not present

## 2023-06-23 DIAGNOSIS — I771 Stricture of artery: Secondary | ICD-10-CM | POA: Diagnosis not present

## 2023-06-23 LAB — PSA: PSA: 0.55

## 2023-07-03 DIAGNOSIS — K769 Liver disease, unspecified: Secondary | ICD-10-CM | POA: Diagnosis not present

## 2023-07-03 DIAGNOSIS — N200 Calculus of kidney: Secondary | ICD-10-CM | POA: Diagnosis not present

## 2023-07-03 DIAGNOSIS — C641 Malignant neoplasm of right kidney, except renal pelvis: Secondary | ICD-10-CM | POA: Diagnosis not present

## 2023-07-03 DIAGNOSIS — K439 Ventral hernia without obstruction or gangrene: Secondary | ICD-10-CM | POA: Diagnosis not present

## 2023-07-03 DIAGNOSIS — Z905 Acquired absence of kidney: Secondary | ICD-10-CM | POA: Diagnosis not present

## 2023-07-08 ENCOUNTER — Other Ambulatory Visit: Payer: Self-pay | Admitting: Internal Medicine

## 2023-07-25 ENCOUNTER — Encounter: Payer: Self-pay | Admitting: Internal Medicine

## 2023-08-12 ENCOUNTER — Other Ambulatory Visit: Payer: Self-pay | Admitting: Internal Medicine

## 2023-10-08 ENCOUNTER — Other Ambulatory Visit: Payer: Self-pay | Admitting: Internal Medicine

## 2023-10-08 MED ORDER — LEVOTHYROXINE SODIUM 100 MCG PO TABS: 200.0000 ug | ORAL_TABLET | Freq: Every day | ORAL | 0 refills | Status: DC

## 2023-11-18 ENCOUNTER — Encounter: Payer: BC Managed Care – PPO | Admitting: Internal Medicine

## 2023-11-21 ENCOUNTER — Ambulatory Visit (INDEPENDENT_AMBULATORY_CARE_PROVIDER_SITE_OTHER): Payer: BC Managed Care – PPO | Admitting: Internal Medicine

## 2023-11-21 VITALS — BP 128/80 | HR 57 | Temp 98.1°F | Resp 16 | Ht 73.0 in | Wt 230.5 lb

## 2023-11-21 DIAGNOSIS — E039 Hypothyroidism, unspecified: Secondary | ICD-10-CM

## 2023-11-21 DIAGNOSIS — R739 Hyperglycemia, unspecified: Secondary | ICD-10-CM

## 2023-11-21 DIAGNOSIS — C641 Malignant neoplasm of right kidney, except renal pelvis: Secondary | ICD-10-CM | POA: Insufficient documentation

## 2023-11-21 DIAGNOSIS — E559 Vitamin D deficiency, unspecified: Secondary | ICD-10-CM

## 2023-11-21 DIAGNOSIS — Z0001 Encounter for general adult medical examination with abnormal findings: Secondary | ICD-10-CM

## 2023-11-21 DIAGNOSIS — Z9189 Other specified personal risk factors, not elsewhere classified: Secondary | ICD-10-CM

## 2023-11-21 DIAGNOSIS — Z0000 Encounter for general adult medical examination without abnormal findings: Secondary | ICD-10-CM

## 2023-11-21 MED ORDER — VITAMIN D3 25 MCG (1000 UT) PO CAPS: 2000.0000 [IU] | ORAL_CAPSULE | Freq: Every day | ORAL | Status: AC

## 2023-11-21 NOTE — Patient Instructions (Addendum)
 Vaccines I recommend; Covid booster Flu shot     GO TO THE LAB : Get the blood work     Please go to the front desk: Arrange for a physical exam in 1 year    "Health Care Power of attorney" (Also know as a  "Living will" or  Advance care planning documents)  If you already have a living will or healthcare power of attorney, is recommended you bring the copy to be scanned in your chart.   The document will be available to all the doctors you see in the system.  If you are over 19 y/o and don't have the document, please read:  Advance care planning is a process that supports adults in  understanding and sharing their preferences regarding future medical care.  The patient's preferences are recorded in documents called Advance Directives and the can be modified at any time while the patient is in full mental capacity.     More information at: [email or web address]

## 2023-11-21 NOTE — Progress Notes (Signed)
 Subjective:    Patient ID: Brad Stewart, male    DOB: 04/14/1961, 63 y.o.   MRN: [id number]  DOS:  11/21/2023 Type of visit - description: CPX  Here for CPX. Feeling well.  Review of Systems   A 14 point review of systems is negative    Past Medical History:  Diagnosis Date   COVID-19 09/2019   History of kidney stones    Hypothyroidism    PONV (postoperative nausea and vomiting)     Past Surgical History:  Procedure Laterality Date   INGUINAL HERNIA REPAIR Left 06/23/2020   Procedure: OPEN REPAIR LEFT INGUINAL HERNIA WITH MESH;  Surgeon: Darnell Level, MD;  Location: [facility];  Service: General;  Laterality: Left;   INGUINAL HERNIA REPAIR Left 01/08/2023   Procedure: HERNIA REPAIR INGUINAL ADULT WITH MESH;  Surgeon: Axel Filler, MD;  Location: [facility];  Service: General;  Laterality: Left;   ROBOTIC ASSITED PARTIAL NEPHRECTOMY Left 01/08/2023   Procedure: XI ROBOTIC ASSITED PARTIAL NEPHRECTOMY AND CYST DECORTICATION;  Surgeon: Loletta Parish., MD;  Location: [facility];  Service: Urology;  Laterality: Left;  5 HRS TOTAL   SHOULDER ARTHROSCOPY Left    SHOULDER ARTHROSCOPY Right    UMBILICAL HERNIA REPAIR N/A 06/23/2020   Procedure: OPEN REPAIR UMBILICAL HERNIA WITH MESH;  Surgeon: Darnell Level, MD;  Location: [facility];  Service: General;  Laterality: N/A;   Social History   Socioeconomic History   Marital status: Divorced    Spouse name: Not on file   Number of children: 2   Years of education: Not on file   Highest education level: Bachelor's degree (e.g., BA, AB, BS)  Occupational History   Occupation: banker   Tobacco Use   Smoking status: Never   Smokeless tobacco: Never  Vaping Use   Vaping status: Never Used  Substance and Sexual Activity   Alcohol use: Yes    Alcohol/week: 0.0 standard drinks of alcohol    Comment: socially    Drug use: No   Sexual activity: Not Currently  Other Topics Concern   Not on file  Social History Narrative   Household:  self, divorced     2 adult children   Social Drivers of Corporate investment banker Strain: Low Risk  (11/21/2023)   Overall Financial Resource Strain (CARDIA)    Difficulty of Paying Living Expenses: Not hard at all  Food Insecurity: No Food Insecurity (11/21/2023)   Hunger Vital Sign    Worried About Running Out of Food in the Last Year: Never true    Ran Out of Food in the Last Year: Never true  Transportation Needs: No Transportation Needs (11/21/2023)   PRAPARE - Administrator, Civil Service (Medical): No    Lack of Transportation (Non-Medical): No  Physical Activity: Sufficiently Active (11/21/2023)   Exercise Vital Sign    Days of Exercise per Week: 4 days    Minutes of Exercise per Session: 60 min  Stress: No Stress Concern Present (11/21/2023)   [employer] of Occupational Health - Occupational Stress Questionnaire    Feeling of Stress : Only a little  Social Connections: Socially Isolated (11/21/2023)   Social Connection and Isolation Panel [NHANES]    Frequency of Communication with Friends and Family: Twice a week    Frequency of Social Gatherings with Friends and Family: Three times a week    Attends Religious Services: Never    Active Member of Clubs or Organizations: No  Attends Banker Meetings: Not on file    Marital Status: Divorced  Intimate Partner Violence: Not At Risk (01/08/2023)   Humiliation, Afraid, Rape, and Kick questionnaire    Fear of Current or Ex-Partner: No    Emotionally Abused: No    Physically Abused: No    Sexually Abused: No    Current Outpatient Medications  Medication Instructions   levothyroxine (SYNTHROID) 200 mcg, Oral, Daily before breakfast   Vitamin D3 2,000 Units, Oral, Daily       Objective:   Physical Exam BP 128/80   Pulse (!) 57   Temp 98.1 F (36.7 C) (Oral)   Resp 16   Ht 6\' 1"  (1.854 m)   Wt 230 lb 8 oz (104.6 kg)   SpO2 97%   BMI 30.41 kg/m  General: Well developed, NAD, BMI  noted Neck: No  thyromegaly  HEENT:  Normocephalic . Face symmetric, atraumatic Lungs:  CTA B Normal respiratory effort, no intercostal retractions, no accessory muscle use. Heart: RRR,  no murmur.  Abdomen:  Not distended, soft, non-tender. No rebound or rigidity.   Lower extremities: no pretibial edema bilaterally  Skin: Exposed areas without rash. Not pale. Not jaundice Neurologic:  alert & oriented X3.  Speech normal, gait appropriate for age and unassisted Strength symmetric and appropriate for age.  Psych: Cognition and judgment appear intact.  Cooperative with normal attention span and concentration.  Behavior appropriate. No anxious or depressed appearing.     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  Consider aorta CT follow-up.  03/2022) kin bx, sees dermatology Extensive fractures R leg as a teenager   PLAN: Here for CPX Tdap 2018 S/p Shingrix x2 Vax I rec: Covid and flu shot   Prostate cancer screening:   PSA 0.5 (oct 2024) CCS: Colonoscopy 10/22/2019, next 2028 per GI letter. Labs: CMP FLP CBC A1c TSH vitamin D Diet, exercise: Discussed. ACP info provided  Other issues addressed. Prediabetes: Check A1c Hypothyroidism: Reports good med compliance, check TSH Vitamin D deficiency: On OTCs, checking levels. Renal cancer: Since the last office visit, a renal mass was incidentally found, had a partial nephrectomy 01/2023.  Feeling well. Cardiovascular risk: It is 10.5%, his father has CAD in his 24s, pt's coronary calcium score = 72.  This was discussed in detail with the patient, statins will be beneficial with minimal risks, he is not inclined to take statins at this point. Ascending aorta slightly large per CT: We agreed to discuss the issue next year. RTC 1  year

## 2023-11-22 ENCOUNTER — Encounter: Payer: Self-pay | Admitting: Internal Medicine

## 2023-11-22 LAB — COMPREHENSIVE METABOLIC PANEL
AG Ratio: 1.6 (calc) (ref 1.0–2.5)
ALT: 26 U/L (ref 9–46)
AST: 28 U/L (ref 10–35)
Albumin: 4.5 g/dL (ref 3.6–5.1)
Alkaline phosphatase (APISO): 61 U/L (ref 35–144)
BUN: 16 mg/dL (ref 7–25)
CO2: 25 mmol/L (ref 20–32)
Calcium: 9.7 mg/dL (ref 8.6–10.3)
Chloride: 105 mmol/L (ref 98–110)
Creat: 1.08 mg/dL (ref 0.70–1.35)
Globulin: 2.8 g/dL (ref 1.9–3.7)
Glucose, Bld: 96 mg/dL (ref 65–99)
Potassium: 4.9 mmol/L (ref 3.5–5.3)
Sodium: 141 mmol/L (ref 135–146)
Total Bilirubin: 0.4 mg/dL (ref 0.2–1.2)
Total Protein: 7.3 g/dL (ref 6.1–8.1)
eGFR: 78 mL/min/{1.73_m2} (ref >=60)

## 2023-11-22 LAB — VITAMIN D 25 HYDROXY (VIT D DEFICIENCY, FRACTURES): Vit D, 25-Hydroxy: 41 ng/mL (ref 30–100)

## 2023-11-22 LAB — HEMOGLOBIN A1C
Hgb A1c MFr Bld: 6.2 %{Hb} — ABNORMAL HIGH (ref <5.7)
Mean Plasma Glucose: 131 mg/dL
eAG (mmol/L): 7.3 mmol/L

## 2023-11-22 LAB — LIPID PANEL
Cholesterol: 196 mg/dL (ref <200)
HDL: 40 mg/dL (ref >=40)
LDL Cholesterol (Calc): 127 mg/dL — ABNORMAL HIGH
Non-HDL Cholesterol (Calc): 156 mg/dL — ABNORMAL HIGH (ref <130)
Total CHOL/HDL Ratio: 4.9 (calc) (ref <5.0)
Triglycerides: 173 mg/dL — ABNORMAL HIGH (ref <150)

## 2023-11-22 LAB — CBC WITH DIFFERENTIAL/PLATELET
Absolute Lymphocytes: 1903 {cells}/uL (ref 850–3900)
Absolute Monocytes: 499 {cells}/uL (ref 200–950)
Basophils Absolute: 21 {cells}/uL (ref 0–200)
Basophils Relative: 0.4 %
Eosinophils Absolute: 42 {cells}/uL (ref 15–500)
Eosinophils Relative: 0.8 %
HCT: 39.7 % (ref 38.5–50.0)
Hemoglobin: 13 g/dL — ABNORMAL LOW (ref 13.2–17.1)
MCH: 28.8 pg (ref 27.0–33.0)
MCHC: 32.7 g/dL (ref 32.0–36.0)
MCV: 88 fL (ref 80.0–100.0)
MPV: 8.7 fL (ref 7.5–12.5)
Monocytes Relative: 9.6 %
Neutro Abs: 2735 {cells}/uL (ref 1500–7800)
Neutrophils Relative %: 52.6 %
Platelets: 365 10*3/uL (ref 140–400)
RBC: 4.51 10*6/uL (ref 4.20–5.80)
RDW: 13.2 % (ref 11.0–15.0)
Total Lymphocyte: 36.6 %
WBC: 5.2 10*3/uL (ref 3.8–10.8)

## 2023-11-22 LAB — TSH: TSH: 1.04 m[IU]/L (ref 0.40–4.50)

## 2023-11-22 NOTE — Assessment & Plan Note (Signed)
 Here for CPX  Other issues addressed. Prediabetes: Check A1c Hypothyroidism: Reports good med compliance, check TSH Vitamin D deficiency: On OTCs, checking levels. Renal cancer: Since the last office visit, a renal mass was incidentally found, had a partial nephrectomy 01/2023.  Feeling well. Cardiovascular risk: It is 10.5%, his father has CAD in his 106s, pt's coronary calcium score = 72.  This was discussed in detail with the patient, statins will be beneficial with minimal risks, he is not inclined to take statins at this point. Ascending aorta slightly large per CT: We agreed to discuss the issue next year. RTC 1 year

## 2023-11-22 NOTE — Assessment & Plan Note (Signed)
 Here for CPX Tdap 2018 S/p Shingrix x2 Vax I rec: Covid and flu shot   Prostate cancer screening:   PSA 0.5 (oct 2024) CCS: Colonoscopy 10/22/2019, next 2028 per GI letter. Labs: CMP FLP CBC A1c TSH vitamin D Diet, exercise: Discussed. ACP info provided

## 2024-01-08 ENCOUNTER — Other Ambulatory Visit: Payer: Self-pay | Admitting: Internal Medicine

## 2024-07-06 ENCOUNTER — Ambulatory Visit (HOSPITAL_COMMUNITY)
Admission: RE | Admit: 2024-07-06 | Discharge: 2024-07-06 | Disposition: A | Discharge status: Home / Self Care | Source: Ambulatory Visit | Attending: Urology | Admitting: Urology

## 2024-07-06 ENCOUNTER — Other Ambulatory Visit (HOSPITAL_COMMUNITY): Payer: Self-pay | Admitting: Urology

## 2024-07-06 DIAGNOSIS — C642 Malignant neoplasm of left kidney, except renal pelvis: Secondary | ICD-10-CM
